# Patient Record
Sex: Female | Born: 1970
Health system: Southern US, Community
[De-identification: ages and names within clinical notes are randomized; demographics above are authoritative.]

## PROBLEM LIST (undated history)

## (undated) DIAGNOSIS — N6320 Unspecified lump in the left breast, unspecified quadrant: Secondary | ICD-10-CM

## (undated) DIAGNOSIS — I10 Essential (primary) hypertension: Secondary | ICD-10-CM

## (undated) DIAGNOSIS — E785 Hyperlipidemia, unspecified: Secondary | ICD-10-CM

## (undated) DIAGNOSIS — D649 Anemia, unspecified: Secondary | ICD-10-CM

## (undated) DIAGNOSIS — Z9889 Other specified postprocedural states: Secondary | ICD-10-CM

## (undated) HISTORY — DX: Anemia, unspecified: D64.9

## (undated) HISTORY — PX: BREAST EXCISIONAL BIOPSY: SUR124

## (undated) HISTORY — DX: Unspecified lump in the left breast, unspecified quadrant: N63.20

## (undated) HISTORY — DX: Hyperlipidemia, unspecified: E78.5

## (undated) HISTORY — PX: APPENDECTOMY: SHX54

---

## 2007-03-30 ENCOUNTER — Encounter: Payer: Self-pay | Admitting: Family Medicine

## 2007-04-02 ENCOUNTER — Encounter: Payer: Self-pay | Admitting: Family Medicine

## 2007-07-17 ENCOUNTER — Emergency Department (HOSPITAL_COMMUNITY): Admission: EM | Admit: 2007-07-17 | Discharge: 2007-07-18 | Payer: Self-pay | Admitting: Emergency Medicine

## 2007-08-09 ENCOUNTER — Ambulatory Visit: Payer: Self-pay | Admitting: Family Medicine

## 2007-08-09 DIAGNOSIS — E785 Hyperlipidemia, unspecified: Secondary | ICD-10-CM | POA: Insufficient documentation

## 2007-08-09 DIAGNOSIS — R079 Chest pain, unspecified: Secondary | ICD-10-CM

## 2008-12-31 ENCOUNTER — Encounter: Payer: Self-pay | Admitting: Family Medicine

## 2008-12-31 ENCOUNTER — Encounter: Admission: RE | Admit: 2008-12-31 | Discharge: 2008-12-31 | Payer: Self-pay | Admitting: Obstetrics & Gynecology

## 2009-02-05 ENCOUNTER — Inpatient Hospital Stay (HOSPITAL_COMMUNITY): Admission: RE | Admit: 2009-02-05 | Discharge: 2009-02-08 | Payer: Self-pay | Admitting: Obstetrics and Gynecology

## 2009-08-17 ENCOUNTER — Encounter: Admission: RE | Admit: 2009-08-17 | Discharge: 2009-08-17 | Payer: Self-pay | Admitting: Obstetrics & Gynecology

## 2009-08-17 ENCOUNTER — Encounter: Payer: Self-pay | Admitting: Family Medicine

## 2009-09-21 ENCOUNTER — Ambulatory Visit: Payer: Self-pay | Admitting: Family Medicine

## 2009-09-22 LAB — CONVERTED CEMR LAB
Basophils Relative: 0.1 % (ref 0.0–3.0)
Calcium: 8.9 mg/dL (ref 8.4–10.5)
Cholesterol: 248 mg/dL — ABNORMAL HIGH (ref 0–200)
Creatinine, Ser: 0.6 mg/dL (ref 0.4–1.2)
Direct LDL: 195.9 mg/dL
Glucose, Bld: 99 mg/dL (ref 70–99)
HCT: 40.6 % (ref 36.0–46.0)
HDL: 50.4 mg/dL (ref >39.00)
Hemoglobin: 13.6 g/dL (ref 12.0–15.0)
Lymphocytes Relative: 54.8 % — ABNORMAL HIGH (ref 12.0–46.0)
MCHC: 33.6 g/dL (ref 30.0–36.0)
MCV: 83.8 fL (ref 78.0–100.0)
Monocytes Relative: 7 % (ref 3.0–12.0)
Neutro Abs: 1.7 10*3/uL (ref 1.4–7.7)
Neutrophils Relative %: 37.5 % — ABNORMAL LOW (ref 43.0–77.0)
Potassium: 4.4 meq/L (ref 3.5–5.1)
RBC: 4.84 M/uL (ref 3.87–5.11)
RDW: 12.8 % (ref 11.5–14.6)
Total CHOL/HDL Ratio: 5
Total Protein: 7.1 g/dL (ref 6.0–8.3)
VLDL: 16.4 mg/dL (ref 0.0–40.0)

## 2009-09-28 ENCOUNTER — Ambulatory Visit: Payer: Self-pay | Admitting: Family Medicine

## 2009-09-28 ENCOUNTER — Other Ambulatory Visit: Admission: RE | Admit: 2009-09-28 | Discharge: 2009-09-28 | Payer: Self-pay | Admitting: Family Medicine

## 2009-09-28 ENCOUNTER — Encounter: Payer: Self-pay | Admitting: Family Medicine

## 2009-09-28 DIAGNOSIS — N63 Unspecified lump in unspecified breast: Secondary | ICD-10-CM | POA: Insufficient documentation

## 2009-10-02 ENCOUNTER — Encounter: Payer: Self-pay | Admitting: Internal Medicine

## 2009-10-26 ENCOUNTER — Ambulatory Visit: Payer: Self-pay | Admitting: Family Medicine

## 2009-10-30 LAB — CONVERTED CEMR LAB
Direct LDL: 182.1 mg/dL
HDL: 58.5 mg/dL (ref >39.00)
Total CHOL/HDL Ratio: 4
VLDL: 12.8 mg/dL (ref 0.0–40.0)

## 2010-03-09 ENCOUNTER — Ambulatory Visit: Payer: Self-pay | Admitting: Family Medicine

## 2010-03-10 LAB — CONVERTED CEMR LAB
Cholesterol: 229 mg/dL — ABNORMAL HIGH (ref 0–200)
Total CHOL/HDL Ratio: 4
Triglycerides: 73 mg/dL (ref 0.0–149.0)
VLDL: 14.6 mg/dL (ref 0.0–40.0)

## 2010-03-11 ENCOUNTER — Encounter: Admission: RE | Admit: 2010-03-11 | Discharge: 2010-03-11 | Payer: Self-pay | Admitting: Family Medicine

## 2010-03-11 LAB — HM MAMMOGRAPHY

## 2010-03-25 ENCOUNTER — Encounter: Payer: Self-pay | Admitting: Family Medicine

## 2010-03-30 ENCOUNTER — Ambulatory Visit (HOSPITAL_BASED_OUTPATIENT_CLINIC_OR_DEPARTMENT_OTHER): Admission: RE | Admit: 2010-03-30 | Discharge: 2010-03-30 | Payer: Self-pay | Admitting: General Surgery

## 2010-03-30 HISTORY — PX: BREAST LUMPECTOMY: SHX2

## 2010-03-30 HISTORY — PX: OTHER SURGICAL HISTORY: SHX169

## 2010-09-23 ENCOUNTER — Ambulatory Visit: Payer: Self-pay | Admitting: Family Medicine

## 2010-09-24 LAB — CONVERTED CEMR LAB
AST: 14 units/L (ref 0–37)
Alkaline Phosphatase: 39 units/L (ref 39–117)
BUN: 8 mg/dL (ref 6–23)
Basophils Absolute: 0 10*3/uL (ref 0.0–0.1)
Calcium: 8.8 mg/dL (ref 8.4–10.5)
Creatinine, Ser: 0.6 mg/dL (ref 0.4–1.2)
GFR calc non Af Amer: 150.42 mL/min (ref >60)
HCT: 35.8 % — ABNORMAL LOW (ref 36.0–46.0)
Lymphocytes Relative: 58.2 % — ABNORMAL HIGH (ref 12.0–46.0)
Lymphs Abs: 2.3 10*3/uL (ref 0.7–4.0)
Monocytes Absolute: 0.2 10*3/uL (ref 0.1–1.0)
Neutro Abs: 1.4 10*3/uL (ref 1.4–7.7)
Platelets: 242 10*3/uL (ref 150.0–400.0)
Potassium: 4.5 meq/L (ref 3.5–5.1)
RDW: 16.3 % — ABNORMAL HIGH (ref 11.5–14.6)
Sodium: 134 meq/L — ABNORMAL LOW (ref 135–145)
Total Protein: 6.9 g/dL (ref 6.0–8.3)
Triglycerides: 83 mg/dL (ref 0.0–149.0)

## 2010-09-30 ENCOUNTER — Ambulatory Visit: Payer: Self-pay | Admitting: Family Medicine

## 2010-09-30 ENCOUNTER — Other Ambulatory Visit: Admission: RE | Admit: 2010-09-30 | Discharge: 2010-09-30 | Payer: Self-pay | Admitting: Family Medicine

## 2010-09-30 LAB — HM PAP SMEAR

## 2010-10-05 ENCOUNTER — Encounter: Payer: Self-pay | Admitting: Family Medicine

## 2010-12-28 NOTE — Assessment & Plan Note (Signed)
Summary: PT FASTING/CPX/WITH PAP/RCD   Vital Signs:  Patient profile:   40 year old female Menstrual status:  irregular Height:      65 inches Weight:      136 pounds BMI:     22.71 O2 Sat:      95 % Temp:     98.6 degrees F Pulse rate:   73 / minute BP sitting:   120 / 80  (left arm) Cuff size:   regular  Vitals Entered By: Pura Spice, RN (September 23, 2010 10:05 AM) CC: cpx no pap today on menses fasting for repeat on labs from 03-09-10   History of Present Illness: 40 yr old female for a cpx. She is on her menses so we have to do her Pap smear at a later date. She feels fine and has no complaints.   Allergies (verified): No Known Drug Allergies  Past History:  Past Medical History: Hyperlipidemia left breast lump, benign fibroadenoma  Past Surgical History: Appendectomy removal of left breast lump 03-30-10 per Dr. Claud Kelp  Family History: Reviewed history from 09/21/2009 and no changes required.  Family History Breast cancer 1st degree relative <50 (sister)  Social History: Reviewed history from 08/09/2007 and no changes required. Occupation: CNA Married Never Smoked Alcohol use-no Drug use-no  Review of Systems  The patient denies anorexia, fever, weight loss, weight gain, vision loss, decreased hearing, hoarseness, chest pain, syncope, dyspnea on exertion, peripheral edema, prolonged cough, headaches, hemoptysis, abdominal pain, melena, hematochezia, severe indigestion/heartburn, hematuria, incontinence, genital sores, muscle weakness, suspicious skin lesions, transient blindness, difficulty walking, depression, unusual weight change, abnormal bleeding, enlarged lymph nodes, angioedema, breast masses, and testicular masses.         Flu Vaccine Consent Questions     Do you have a history of severe allergic reactions to this vaccine? no    Any prior history of allergic reactions to egg and/or gelatin? no    Do you have a sensitivity to the preservative  Thimersol? no    Do you have a past history of Guillan-Barre Syndrome? no    Do you currently have an acute febrile illness? no    Have you ever had a severe reaction to latex? no    Vaccine information given and explained to patient? yes    Are you currently pregnant? no    Lot Number:[id number]   Exp Date:05/28/2011   Site Given  Left Deltoid IM Pura Spice, RN  September 23, 2010 10:18 AM    Physical Exam  General:  Well-developed,well-nourished,in no acute distress; alert,appropriate and cooperative throughout examination Head:  Normocephalic and atraumatic without obvious abnormalities. No apparent alopecia or balding. Eyes:  No corneal or conjunctival inflammation noted. EOMI. Perrla. Funduscopic exam benign, without hemorrhages, exudates or papilledema. Vision grossly normal. Ears:  External ear exam shows no significant lesions or deformities.  Otoscopic examination reveals clear canals, tympanic membranes are intact bilaterally without bulging, retraction, inflammation or discharge. Hearing is grossly normal bilaterally. Nose:  External nasal examination shows no deformity or inflammation. Nasal mucosa are pink and moist without lesions or exudates. Mouth:  Oral mucosa and oropharynx without lesions or exudates.  Teeth in good repair. Neck:  No deformities, masses, or tenderness noted. Chest Wall:  No deformities, masses, or tenderness noted. Lungs:  Normal respiratory effort, chest expands symmetrically. Lungs are clear to auscultation, no crackles or wheezes. Heart:  Normal rate and regular rhythm. S1 and S2 normal without gallop, murmur, click, rub or other  extra sounds. Abdomen:  Bowel sounds positive,abdomen soft and non-tender without masses, organomegaly or hernias noted. Msk:  No deformity or scoliosis noted of thoracic or lumbar spine.   Pulses:  R and L carotid,radial,femoral,dorsalis pedis and posterior tibial pulses are full and equal bilaterally Extremities:  No  clubbing, cyanosis, edema, or deformity noted with normal full range of motion of all joints.   Neurologic:  No cranial nerve deficits noted. Station and gait are normal. Plantar reflexes are down-going bilaterally. DTRs are symmetrical throughout. Sensory, motor and coordinative functions appear intact. Skin:  Intact without suspicious lesions or rashes Cervical Nodes:  No lymphadenopathy noted Axillary Nodes:  No palpable lymphadenopathy Inguinal Nodes:  No significant adenopathy Psych:  Cognition and judgment appear intact. Alert and cooperative with normal attention span and concentration. No apparent delusions, illusions, hallucinations   Impression & Recommendations:  Problem # 1:  HEALTH MAINTENANCE EXAM (ICD-V70.0)  Orders: UA Dipstick w/o Micro (automated)  (81003) Venipuncture ([address]) TLB-Lipid Panel (80061-LIPID) TLB-BMP (Basic Metabolic Panel-BMET) (80048-METABOL) TLB-CBC Platelet - w/Differential (85025-CBCD) TLB-Hepatic/Liver Function Pnl (80076-HEPATIC) TLB-TSH (Thyroid Stimulating Hormone) (84443-TSH)  Complete Medication List: 1)  Zocor 40 Mg Tabs (Simvastatin) .Marland Kitchen.. 1 once daily  Other Orders: Admin 1st Vaccine (60454) Flu Vaccine 48yrs + [phone])  Patient Instructions: 1)  get labs today   Orders Added: 1)  Admin 1st Vaccine [90471] 2)  Flu Vaccine 42yrs + [90658] 3)  Est. Patient 18-39 years [99395] 4)  UA Dipstick w/o Micro (automated)  [81003] 5)  Venipuncture [36415] 6)  TLB-Lipid Panel [80061-LIPID] 7)  TLB-BMP (Basic Metabolic Panel-BMET) [80048-METABOL] 8)  TLB-CBC Platelet - w/Differential [85025-CBCD] 9)  TLB-Hepatic/Liver Function Pnl [80076-HEPATIC] 10)  TLB-TSH (Thyroid Stimulating Hormone) [91478-GNF]  Appended Document: Orders Update    Clinical Lists Changes  Orders: Added new Service order of Specimen Handling (62130) - Signed Observations: Added new observation of COMMENTS: Wynona Canes, CMA  September 23, 2010 1:19 PM   (09/23/2010 10:48) Added new observation of PH URINE: 7.0  (09/23/2010 10:48) Added new observation of SPEC GR URIN: 1.020  (09/23/2010 10:48) Added new observation of APPEARANCE U: Clear  (09/23/2010 10:48) Added new observation of UA COLOR: yellow  (09/23/2010 10:48) Added new observation of WBC DIPSTK U: negative  (09/23/2010 10:48) Added new observation of NITRITE URN: negative  (09/23/2010 10:48) Added new observation of UROBILINOGEN: 0.2  (09/23/2010 10:48) Added new observation of PROTEIN, URN: 1+  (09/23/2010 10:48) Added new observation of BLOOD UR DIP: 3+  (09/23/2010 10:48) Added new observation of KETONES URN: negative  (09/23/2010 10:48) Added new observation of BILIRUBIN UR: negative  (09/23/2010 10:48) Added new observation of GLUCOSE, URN: negative  (09/23/2010 10:48)      Laboratory Results   Urine Tests  Date/Time Recieved: September 23, 2010 1:19 PM  Date/Time Reported: September 23, 2010 1:19 PM   Routine Urinalysis   Color: yellow Appearance: Clear Glucose: negative   (Normal Range: Negative) Bilirubin: negative   (Normal Range: Negative) Ketone: negative   (Normal Range: Negative) Spec. Gravity: 1.020   (Normal Range: 1.003-1.035) Blood: 3+   (Normal Range: Negative) pH: 7.0   (Normal Range: 5.0-8.0) Protein: 1+   (Normal Range: Negative) Urobilinogen: 0.2   (Normal Range: 0-1) Nitrite: negative   (Normal Range: Negative) Leukocyte Esterace: negative   (Normal Range: Negative)    Comments: Wynona Canes, CMA  September 23, 2010 1:19 PM      Appended Document: PT FASTING/CPX/WITH PAP/RCD of note, she was on her menses that day

## 2010-12-28 NOTE — Letter (Signed)
Summary: Report of Medical Examination  Report of Medical Examination   Imported By: Maryln Gottron 05/14/2010 15:41:31  _____________________________________________________________________  External Attachment:    Type:   Image     Comment:   External Document

## 2010-12-28 NOTE — Assessment & Plan Note (Signed)
Summary: PAP ONLY/RCD   Vital Signs:  Patient profile:   40 year old female Menstrual status:  irregular LMP:     08/28/2010 BP sitting:   120 / 80  (left arm) Cuff size:   regular  Vitals Entered By: Pura Spice, RN (September 30, 2010 9:39 AM) CC: pap only  LMP (date): 08/28/2010     Enter LMP: 08/28/2010 Last PAP Result NEGATIVE FOR INTRAEPITHELIAL LESIONS OR MALIGNANCY.   History of Present Illness: Here for a breast and pelvic exam after her cpx on 09-23-10. She is doing well with no complaints.   Allergies: No Known Drug Allergies  Past History:  Past Medical History: Reviewed history from 09/23/2010 and no changes required. Hyperlipidemia left breast lump, benign fibroadenoma  Past Surgical History: Reviewed history from 09/23/2010 and no changes required. Appendectomy removal of left breast lump 03-30-10 per Dr. Claud Kelp  Review of Systems  The patient denies anorexia, fever, weight loss, weight gain, vision loss, decreased hearing, hoarseness, chest pain, syncope, dyspnea on exertion, peripheral edema, prolonged cough, headaches, hemoptysis, abdominal pain, melena, hematochezia, severe indigestion/heartburn, hematuria, incontinence, genital sores, muscle weakness, suspicious skin lesions, transient blindness, difficulty walking, depression, unusual weight change, abnormal bleeding, enlarged lymph nodes, angioedema, breast masses, and testicular masses.    Physical Exam  General:  Well-developed,well-nourished,in no acute distress; alert,appropriate and cooperative throughout examination Breasts:  No mass, nodules, thickening, tenderness, bulging, retraction, inflamation, nipple discharge or skin changes noted.   Genitalia:  Pelvic Exam:        External: normal female genitalia without lesions or masses        Vagina: normal without lesions or masses        Cervix: normal without lesions or masses        Adnexa: normal bimanual exam without masses or  fullness        Uterus: normal by palpation        Pap smear: performed   Impression & Recommendations:  Problem # 1:  LUMP OR MASS IN BREAST (ICD-611.72)  Complete Medication List: 1)  Lipitor 40 Mg Tabs (Atorvastatin calcium) .Marland Kitchen.. 1 by mouth once daily  Patient Instructions: 1)  Please schedule a follow-up appointment in 1 year.    Orders Added: 1)  Est. Patient Level IV [13244]

## 2010-12-28 NOTE — Letter (Signed)
Summary: Results Follow-up Letter  [facility]  [address] [place], [place] [address]   Phone: [phone]  Fax: [phone]    10/05/2010  [address] [place], [place]  [address]  Dear Ms. Mayford Knife,   The following are the results of your recent test(s):  Test     Result     Pap Smear    Normal____yes repeat 1 year ___     Sincerely,  Dr Kathie Rhodes. Clent Ridges MD  [place] [facility]

## 2010-12-28 NOTE — Assessment & Plan Note (Signed)
Summary: ROA/FUP/RCD   Vital Signs:  Patient profile:   40 year old female Menstrual status:  irregular Weight:      136 pounds BP sitting:   126 / 86  (left arm) Cuff size:   regular  Vitals Entered By: Raechel Ache, RN (March 09, 2010 8:56 AM) CC: ROV, fasting. Still has lump L breast- wants it checked. Has not had mammogram and no longer breastfeeding Comments rara   History of Present Illness: Here to follow up a left breast lump as well as her cholesterol. She has been watching a strict diet for 3 months. Her last breast US on 08-17-10 demonstrated the lump had decreased slightly in size. She has stopped breast feeding, and now we are ready to get her first mammogram. The lump is still present, but never painful.   Allergies: No Known Drug Allergies  Past History:  Past Medical History: Reviewed history from 09/28/2009 and no changes required. Hyperlipidemia left breast lump  Past Surgical History: Reviewed history from 08/09/2007 and no changes required. Appendectomy  Review of Systems  The patient denies anorexia, fever, weight loss, weight gain, vision loss, decreased hearing, hoarseness, chest pain, syncope, dyspnea on exertion, peripheral edema, prolonged cough, headaches, hemoptysis, abdominal pain, melena, hematochezia, severe indigestion/heartburn, hematuria, incontinence, genital sores, muscle weakness, suspicious skin lesions, transient blindness, difficulty walking, depression, unusual weight change, abnormal bleeding, enlarged lymph nodes, angioedema, breast masses, and testicular masses.    Physical Exam  General:  Well-developed,well-nourished,in no acute distress; alert,appropriate and cooperative throughout examination Breasts:  small, well defined, round, smooth, nontender lump in the left breast at the 8 o'clock position   Impression & Recommendations:  Problem # 1:  LUMP OR MASS IN BREAST (ICD-611.72)  Orders: Misc. Referral (Misc.  Ref)  Problem # 2:  HYPERLIPIDEMIA (ICD-272.4)  Orders: Venipuncture ([address]) TLB-Lipid Panel (80061-LIPID)  Patient Instructions: 1)  We will recheck her lipids today. Will set up a mammogram and [place] soon on her breasts.

## 2010-12-28 NOTE — Letter (Signed)
Summary: [facility]   Imported By: Maryln Gottron 04/05/2010 15:20:31  _____________________________________________________________________  External Attachment:    Type:   Image     Comment:   External Document

## 2011-02-15 LAB — DIFFERENTIAL
Basophils Relative: 0 % (ref 0–1)
Eosinophils Absolute: 0 10*3/uL (ref 0.0–0.7)
Lymphocytes Relative: 50 % — ABNORMAL HIGH (ref 12–46)
Lymphs Abs: 2.2 10*3/uL (ref 0.7–4.0)
Neutro Abs: 2 10*3/uL (ref 1.7–7.7)
Neutrophils Relative %: 44 % (ref 43–77)

## 2011-02-15 LAB — COMPREHENSIVE METABOLIC PANEL
CO2: 29 mEq/L (ref 19–32)
Calcium: 9.3 mg/dL (ref 8.4–10.5)
GFR calc Af Amer: >60 mL/min (ref >60)
Glucose, Bld: 91 mg/dL (ref 70–99)
Potassium: 4.6 mEq/L (ref 3.5–5.1)
Sodium: 140 mEq/L (ref 135–145)

## 2011-02-15 LAB — URINALYSIS, ROUTINE W REFLEX MICROSCOPIC
Glucose, UA: NEGATIVE mg/dL
Hgb urine dipstick: NEGATIVE
Protein, ur: NEGATIVE mg/dL
Specific Gravity, Urine: 1.025 (ref 1.005–1.030)
Urobilinogen, UA: 1 mg/dL (ref 0.0–1.0)
pH: 7 (ref 5.0–8.0)

## 2011-02-15 LAB — URINE MICROSCOPIC-ADD ON

## 2011-02-15 LAB — CBC
Hemoglobin: 12.7 g/dL (ref 12.0–15.0)
MCHC: 32.6 g/dL (ref 30.0–36.0)
RBC: 4.88 MIL/uL (ref 3.87–5.11)
WBC: 4.5 10*3/uL (ref 4.0–10.5)

## 2011-03-10 LAB — CBC
Hemoglobin: 10.8 g/dL — ABNORMAL LOW (ref 12.0–15.0)
Hemoglobin: 11.7 g/dL — ABNORMAL LOW (ref 12.0–15.0)
MCHC: 32.6 g/dL (ref 30.0–36.0)
MCHC: 32.9 g/dL (ref 30.0–36.0)
MCV: 84.1 fL (ref 78.0–100.0)
MCV: 84.3 fL (ref 78.0–100.0)
Platelets: 213 10*3/uL (ref 150–400)
Platelets: 233 10*3/uL (ref 150–400)
RBC: 4.41 MIL/uL (ref 3.87–5.11)
RDW: 16.3 % — ABNORMAL HIGH (ref 11.5–15.5)
RDW: 16.5 % — ABNORMAL HIGH (ref 11.5–15.5)

## 2011-04-05 ENCOUNTER — Other Ambulatory Visit: Payer: Self-pay | Admitting: Family Medicine

## 2011-04-05 DIAGNOSIS — Z1231 Encounter for screening mammogram for malignant neoplasm of breast: Secondary | ICD-10-CM

## 2011-04-12 NOTE — Discharge Summary (Signed)
NAME:  Alyssa Khan, Alyssa Khan            ACCOUNT NO.:  [id number]   MEDICAL RECORD NO.:  [id number]          PATIENT TYPE:  INP   LOCATION:  9125                          FACILITY:  [facility]   PHYSICIAN:  Gerrit Friends. Aldona Bar, M.D.   DATE OF BIRTH:  1971-06-15   DATE OF ADMISSION:  02/05/2009  DATE OF DISCHARGE:  02/08/2009                               DISCHARGE SUMMARY   DISCHARGE DIAGNOSIS:  1. Term pregnancy, delivered, 7 pounds 8 ounces, female infant, Apgars 9      and 9.  2. Blood type B+.  3. Repeat cesarean section at term.   PROCEDURE:  Repeat cesarean section at term.   SUMMARY:  This 40 year old gravida 3, now para 2 was admitted at 27  weeks' gestation for a repeat cesarean section having been delivered of  her first baby in 2003 by cesarean section.  She was taken to the  operating room on February 05, 2009, by Dr. Henderson Cloud and was delivered of a  female infant weighing 7 pounds 7 ounces with good Apgars.  Her  postoperative course was benign.  Her discharge hemoglobin was 11.7 with  a white count of 8500 and a platelet count of 264,000.  On the morning  of February 08, 2009, she was ambulating well, tolerating a regular diet  well, having normal bowel and bladder function and was afebrile.  Her  breast-feeding was going well and she was desirous of discharge.  Accordingly, her staples were removed and her wound was Steri-Stripped  with Benzoin.  She was given all appropriate instructions on the morning  of February 08, 2009, per discharge brochure and understood all  instructions well.   Discharge medications include vitamins one a day as long as she is  breast-feeding and she was given prescriptions for Motrin 600 mg to use  every 6 hours as needed for cramping or mild pain and Tylox 1 to 2 every  4-6 hours as needed for more severe pain.  She will return to the office  in approximately four weeks' time for postpartum followup or as needed.   CONDITION ON DISCHARGE:  Improved.      Gerrit Friends. Aldona Bar, M.D.  Electronically Signed     RMW/MEDQ  D:  02/08/2009  T:  02/09/2009  Job:  [id number]

## 2011-04-12 NOTE — Op Note (Signed)
NAME:  Alyssa Khan, Alyssa Khan            ACCOUNT NO.:  [id number]   MEDICAL RECORD NO.:  [id number]          PATIENT TYPE:  INP   LOCATION:  9199                          FACILITY:  [facility]   PHYSICIAN:  Carrington Clamp, M.D. DATE OF BIRTH:  [birth year]   DATE OF PROCEDURE:  DATE OF DISCHARGE:                               OPERATIVE REPORT   PREOPERATIVE DIAGNOSIS:  Repeat cesarean section at term.   POSTOPERATIVE DIAGNOSIS:  Repeat cesarean section at term.   PROCEDURE:  Primary low-transverse cesarean section.   SURGEON:  Carrington Clamp, MD.   ANESTHESIA:  Spinal.   FINDINGS:  Female infant, Apgars 9 and 9, weight 7 pounds 7 ounces.  Normal tubes, ovaries, and uterus seen.   SPECIMENS:  None.   ESTIMATED BLOOD LOSS:  600 mL.   IV FLUIDS:  2400 mL.   URINE OUTPUT:  400 mL.   COMPLICATIONS:  None.   TECHNIQUE:  After adequate spinal anesthesia was achieved, the patient  prepped and draped in usual sterile fashion in dorsal supine position  with leftward tilt.  A Pfannenstiel skin incision was made with the  scalpel and carried down to the fascia with Bovie cautery.  Fascia was  incised in midline with the scalpel and carried in transverse  curvilinear manner with the Mayo scissors.  The rectus muscles were  reflected from the fascia superiorly and inferiorly with blunt and sharp  dissection.  The rectus muscles were split in the midline with a scalpel  and then carefully a portion of peritoneum was entered into with the  Metzenbaum scissors.  The incision was extended in superior and inferior  manner with good visualization and the bowel and the bladder.   The Alexis instrument was introduced and the vesicouterine fascia tented  up and incised in transverse curvilinear manner.  Bladder flap was  created and retracted inferiorly.  A 2-cm incision made in the upper  portion of the lower uterine segment until clear fluid was noted on  entry into the amnion.  The uterine incision  was extended manually and  also with the bandage scissors.  Baby was identified in the vertex  presentation, delivered without any complication.  Baby was bulb  suctioned.  Cord was clamped and cut and baby was handed to awaiting  pediatrics.   The uterus was cleared of all debris and the uterine incision then  reapproximated with running-locked stitch of 0 Monocryl.  An imbricating  layer of 0 Monocryl was performed as well.  Once hemostasis was achieved  and irrigation was performed, all instruments were withdrawn from the  abdomen and peritoneum was closed with running stitch of 2-0 Vicryl.  This incorporated the rectus muscles as a separate layer.  A single  stitch was placed in the rectus muscle to ensure hemostasis of the  vessel that had been incised during the dissection.  The fascia was then  closed with running stitch of 0 Vicryl.  The subcutaneous tissue was  rendered hemostatic with Bovie cautery and irrigation.  This layer was  then closed with interrupted stitches of 2-0 plain gut.  These skin was  closed with staples.  The patient tolerated the procedure well and was  returned to recovery room in stable condition.      Carrington Clamp, M.D.  Electronically Signed     MH/MEDQ  D:  02/05/2009  T:  02/06/2009  Job:  [id number]

## 2011-04-15 ENCOUNTER — Ambulatory Visit
Admission: RE | Admit: 2011-04-15 | Discharge: 2011-04-15 | Disposition: A | Discharge status: Home / Self Care | Payer: Commercial Managed Care - PPO | Source: Ambulatory Visit | Attending: Family Medicine | Admitting: Family Medicine

## 2011-04-15 DIAGNOSIS — Z1231 Encounter for screening mammogram for malignant neoplasm of breast: Secondary | ICD-10-CM

## 2011-09-09 LAB — TYPE AND SCREEN: Antibody Screen: NEGATIVE

## 2011-09-09 LAB — URINALYSIS, ROUTINE W REFLEX MICROSCOPIC
Protein, ur: NEGATIVE
Specific Gravity, Urine: 1.018
Urobilinogen, UA: 0.2
pH: 7.5

## 2011-09-09 LAB — URINE MICROSCOPIC-ADD ON

## 2011-09-09 LAB — GC/CHLAMYDIA PROBE AMP, GENITAL
Chlamydia, DNA Probe: NEGATIVE
GC Probe Amp, Genital: NEGATIVE

## 2011-09-09 LAB — RPR: RPR Ser Ql: NONREACTIVE

## 2011-09-26 ENCOUNTER — Encounter: Payer: Self-pay | Admitting: Family Medicine

## 2011-09-26 ENCOUNTER — Other Ambulatory Visit (HOSPITAL_COMMUNITY)
Admission: RE | Admit: 2011-09-26 | Discharge: 2011-09-26 | Disposition: A | Discharge status: Home / Self Care | Payer: 59 | Source: Ambulatory Visit | Attending: Family Medicine | Admitting: Family Medicine

## 2011-09-26 ENCOUNTER — Ambulatory Visit (INDEPENDENT_AMBULATORY_CARE_PROVIDER_SITE_OTHER): Payer: Commercial Managed Care - PPO | Admitting: Family Medicine

## 2011-09-26 VITALS — BP 124/80 | HR 77 | Temp 99.0°F | Ht 65.5 in | Wt 146.0 lb

## 2011-09-26 DIAGNOSIS — Z23 Encounter for immunization: Secondary | ICD-10-CM

## 2011-09-26 DIAGNOSIS — Z0000 Encounter for general adult medical examination without abnormal findings: Secondary | ICD-10-CM

## 2011-09-26 DIAGNOSIS — Z01419 Encounter for gynecological examination (general) (routine) without abnormal findings: Secondary | ICD-10-CM | POA: Insufficient documentation

## 2011-09-26 LAB — CBC WITH DIFFERENTIAL/PLATELET
Basophils Relative: 0.5 % (ref 0.0–3.0)
Eosinophils Absolute: 0 10*3/uL (ref 0.0–0.7)
Eosinophils Relative: 0.6 % (ref 0.0–5.0)
HCT: 34.5 % — ABNORMAL LOW (ref 36.0–46.0)
Lymphs Abs: 2.9 10*3/uL (ref 0.7–4.0)
MCHC: 32.4 g/dL (ref 30.0–36.0)
MCV: 73.3 fl — ABNORMAL LOW (ref 78.0–100.0)
Monocytes Absolute: 0.3 10*3/uL (ref 0.1–1.0)
Neutro Abs: 2 10*3/uL (ref 1.4–7.7)
Neutrophils Relative %: 37.3 % — ABNORMAL LOW (ref 43.0–77.0)
RBC: 4.7 Mil/uL (ref 3.87–5.11)
WBC: 5.3 10*3/uL (ref 4.5–10.5)

## 2011-09-26 LAB — HEPATIC FUNCTION PANEL
AST: 27 U/L (ref 0–37)
Albumin: 4.4 g/dL (ref 3.5–5.2)
Alkaline Phosphatase: 47 U/L (ref 39–117)
Bilirubin, Direct: 0.1 mg/dL (ref 0.0–0.3)
Total Protein: 7.5 g/dL (ref 6.0–8.3)

## 2011-09-26 LAB — BASIC METABOLIC PANEL
CO2: 24 mEq/L (ref 19–32)
GFR: 104.73 mL/min (ref >60.00)
Glucose, Bld: 93 mg/dL (ref 70–99)
Potassium: 4 mEq/L (ref 3.5–5.1)
Sodium: 138 mEq/L (ref 135–145)

## 2011-09-26 LAB — POCT URINALYSIS DIPSTICK
Bilirubin, UA: NEGATIVE
Blood, UA: NEGATIVE
Nitrite, UA: NEGATIVE
Spec Grav, UA: 1.025
pH, UA: 5

## 2011-09-26 LAB — LIPID PANEL: Total CHOL/HDL Ratio: 3

## 2011-09-26 MED ORDER — ATORVASTATIN CALCIUM 40 MG PO TABS: 40.0000 mg | ORAL_TABLET | Freq: Every day | ORAL | Status: DC

## 2011-09-26 NOTE — Progress Notes (Signed)
Addended by: Aniceto Boss A on: 09/26/2011 12:10 PM   Modules accepted: Orders

## 2011-09-26 NOTE — Progress Notes (Signed)
Addended by: Aniceto Boss A on: 09/26/2011 11:59 AM   Modules accepted: Orders

## 2011-09-26 NOTE — Progress Notes (Signed)
Subjective:    Patient ID: Alyssa Khan, female    DOB: 06-19-1971, 40 y.o.   MRN: [id number]  HPI 40 yr old female for a cpx. She feels fine and has no concerns. Her menses are regular and unremarkable. She had a normal mammogram on 04-05-11.    Review of Systems  Constitutional: Negative.  Negative for fever, diaphoresis, activity change, appetite change, fatigue and unexpected weight change.  HENT: Negative.  Negative for hearing loss, ear pain, nosebleeds, congestion, sore throat, trouble swallowing, neck pain, neck stiffness, voice change and tinnitus.   Eyes: Negative.  Negative for photophobia, pain, discharge, redness and visual disturbance.  Respiratory: Negative.  Negative for apnea, cough, choking, chest tightness, shortness of breath, wheezing and stridor.   Cardiovascular: Negative.  Negative for chest pain, palpitations and leg swelling.  Gastrointestinal: Negative.  Negative for nausea, vomiting, abdominal pain, diarrhea, constipation, blood in stool, abdominal distention and rectal pain.  Genitourinary: Negative.  Negative for dysuria, urgency, frequency, hematuria, flank pain, vaginal bleeding, vaginal discharge, enuresis, difficulty urinating, vaginal pain and menstrual problem.  Musculoskeletal: Negative.  Negative for myalgias, back pain, joint swelling, arthralgias and gait problem.  Skin: Negative.  Negative for color change, pallor, rash and wound.  Neurological: Negative.  Negative for dizziness, tremors, seizures, syncope, speech difficulty, weakness, light-headedness, numbness and headaches.  Hematological: Negative.  Negative for adenopathy. Does not bruise/bleed easily.  Psychiatric/Behavioral: Negative.  Negative for hallucinations, behavioral problems, confusion, sleep disturbance, dysphoric mood and agitation. The patient is not nervous/anxious.        Objective:   Physical Exam  Constitutional: She appears well-developed and well-nourished. No distress.    HENT:  Head: Normocephalic and atraumatic.  Right Ear: External ear normal.  Left Ear: External ear normal.  Nose: Nose normal.  Mouth/Throat: Oropharynx is clear and moist. No oropharyngeal exudate.  Eyes: Conjunctivae and EOM are normal. Pupils are equal, round, and reactive to light. Right eye exhibits no discharge. Left eye exhibits no discharge. No scleral icterus.  Neck: Normal range of motion. Neck supple. No JVD present. No thyromegaly present.  Cardiovascular: Normal rate, regular rhythm, normal heart sounds and intact distal pulses.  Exam reveals no gallop and no friction rub.   No murmur heard. Pulmonary/Chest: Effort normal and breath sounds normal. No stridor. No respiratory distress. She has no wheezes. She has no rales. She exhibits no tenderness.  Abdominal: Soft. Normal appearance and bowel sounds are normal. She exhibits no distension, no abdominal bruit, no ascites and no mass. There is no hepatosplenomegaly. There is no tenderness. There is no rigidity, no rebound and no guarding. No hernia.  Genitourinary: Rectum normal, vagina normal and uterus normal. No breast swelling, tenderness, discharge or bleeding. Cervix exhibits no motion tenderness, no discharge and no friability. Right adnexum displays no mass, no tenderness and no fullness. Left adnexum displays no mass, no tenderness and no fullness. No erythema, tenderness or bleeding around the vagina. No vaginal discharge found.  Musculoskeletal: Normal range of motion. She exhibits no edema and no tenderness.  Lymphadenopathy:    She has no cervical adenopathy.  Neurological: She is alert. She has normal reflexes. No cranial nerve deficit. She exhibits normal muscle tone. Coordination normal.  Skin: Skin is warm and dry. No rash noted. She is not diaphoretic. No erythema. No pallor.  Psychiatric: She has a normal mood and affect. Her behavior is normal. Judgment and thought content normal.          Assessment & Plan:  Well exam. Get fasting labs today

## 2011-09-28 ENCOUNTER — Telehealth: Payer: Self-pay | Admitting: Family Medicine

## 2011-09-28 ENCOUNTER — Encounter: Payer: Self-pay | Admitting: Family Medicine

## 2011-09-28 NOTE — Telephone Encounter (Signed)
Message copied by Baldemar Friday on Wed Sep 28, 2011  5:35 PM ------      Message from: Gershon Crane A      Created: Tue Sep 27, 2011  8:47 AM       Normal except stable mild anemia. Her cholesterol is excellent

## 2011-09-28 NOTE — Telephone Encounter (Signed)
I spoke with pt and gave results, also put a copy in mail. 

## 2012-03-26 ENCOUNTER — Other Ambulatory Visit: Payer: Self-pay | Admitting: Family Medicine

## 2012-03-26 DIAGNOSIS — Z1231 Encounter for screening mammogram for malignant neoplasm of breast: Secondary | ICD-10-CM

## 2012-04-17 ENCOUNTER — Ambulatory Visit
Admission: RE | Admit: 2012-04-17 | Discharge: 2012-04-17 | Disposition: A | Discharge status: Home / Self Care | Payer: 59 | Source: Ambulatory Visit | Attending: Family Medicine | Admitting: Family Medicine

## 2012-04-17 DIAGNOSIS — Z1231 Encounter for screening mammogram for malignant neoplasm of breast: Secondary | ICD-10-CM

## 2012-04-20 ENCOUNTER — Other Ambulatory Visit: Payer: Self-pay | Admitting: Family Medicine

## 2012-04-20 DIAGNOSIS — R928 Other abnormal and inconclusive findings on diagnostic imaging of breast: Secondary | ICD-10-CM

## 2012-05-02 ENCOUNTER — Ambulatory Visit
Admission: RE | Admit: 2012-05-02 | Discharge: 2012-05-02 | Disposition: A | Discharge status: Home / Self Care | Payer: 59 | Source: Ambulatory Visit | Attending: Family Medicine | Admitting: Family Medicine

## 2012-05-02 DIAGNOSIS — R928 Other abnormal and inconclusive findings on diagnostic imaging of breast: Secondary | ICD-10-CM

## 2012-08-22 ENCOUNTER — Ambulatory Visit (INDEPENDENT_AMBULATORY_CARE_PROVIDER_SITE_OTHER): Payer: 59

## 2012-08-22 DIAGNOSIS — Z23 Encounter for immunization: Secondary | ICD-10-CM

## 2012-09-26 ENCOUNTER — Encounter: Payer: Self-pay | Admitting: Family Medicine

## 2012-09-26 ENCOUNTER — Ambulatory Visit (INDEPENDENT_AMBULATORY_CARE_PROVIDER_SITE_OTHER): Payer: 59 | Admitting: Family Medicine

## 2012-09-26 ENCOUNTER — Encounter: Payer: 59 | Admitting: Family Medicine

## 2012-09-26 VITALS — BP 122/78 | HR 68 | Temp 98.7°F | Ht 66.25 in | Wt 148.0 lb

## 2012-09-26 DIAGNOSIS — Z0000 Encounter for general adult medical examination without abnormal findings: Secondary | ICD-10-CM

## 2012-09-26 LAB — BASIC METABOLIC PANEL
CO2: 25 mEq/L (ref 19–32)
Chloride: 108 mEq/L (ref 96–112)
Potassium: 3.5 mEq/L (ref 3.5–5.1)
Sodium: 139 mEq/L (ref 135–145)

## 2012-09-26 LAB — CBC WITH DIFFERENTIAL/PLATELET
Eosinophils Relative: 0.6 % (ref 0.0–5.0)
HCT: 30.2 % — ABNORMAL LOW (ref 36.0–46.0)
Hemoglobin: 9.1 g/dL — ABNORMAL LOW (ref 12.0–15.0)
Lymphs Abs: 2.1 10*3/uL (ref 0.7–4.0)
MCV: 66.7 fl — ABNORMAL LOW (ref 78.0–100.0)
Monocytes Absolute: 0.3 10*3/uL (ref 0.1–1.0)
Monocytes Relative: 5.8 % (ref 3.0–12.0)
Neutro Abs: 2 10*3/uL (ref 1.4–7.7)
Platelets: 267 10*3/uL (ref 150.0–400.0)
RDW: 18.7 % — ABNORMAL HIGH (ref 11.5–14.6)
WBC: 4.4 10*3/uL — ABNORMAL LOW (ref 4.5–10.5)

## 2012-09-26 LAB — LIPID PANEL
LDL Cholesterol: 98 mg/dL (ref 0–99)
Total CHOL/HDL Ratio: 3

## 2012-09-26 LAB — HEPATIC FUNCTION PANEL
ALT: 20 U/L (ref 0–35)
Alkaline Phosphatase: 38 U/L — ABNORMAL LOW (ref 39–117)
Bilirubin, Direct: 0 mg/dL (ref 0.0–0.3)
Total Bilirubin: 0.5 mg/dL (ref 0.3–1.2)
Total Protein: 7.1 g/dL (ref 6.0–8.3)

## 2012-09-26 LAB — TSH: TSH: 1.13 u[IU]/mL (ref 0.35–5.50)

## 2012-09-26 MED ORDER — ATORVASTATIN CALCIUM 40 MG PO TABS: 40.0000 mg | ORAL_TABLET | Freq: Every day | ORAL | Status: DC

## 2012-09-26 NOTE — Progress Notes (Signed)
  Subjective:    Patient ID: Alyssa Khan, female    DOB: 09/02/1971, 41 y.o.   MRN: [id number]  HPI 41 yr old female for a cpx. She feels well in general although she mentions some occasional chest pains. We discussed these in 2008 and we diagnosed her with costochondritis. These are sharp pains along the sternum which come and go. No SOB. She started her menses 2 days ago.    Review of Systems  Constitutional: Negative.   HENT: Negative.   Eyes: Negative.   Respiratory: Negative.   Cardiovascular: Negative.   Gastrointestinal: Negative.   Genitourinary: Negative for dysuria, urgency, frequency, hematuria, flank pain, decreased urine volume, enuresis, difficulty urinating, pelvic pain and dyspareunia.  Musculoskeletal: Negative.   Skin: Negative.   Neurological: Negative.   Hematological: Negative.   Psychiatric/Behavioral: Negative.        Objective:   Physical Exam  Constitutional: She is oriented to person, place, and time. She appears well-developed and well-nourished. No distress.  HENT:  Head: Normocephalic and atraumatic.  Right Ear: External ear normal.  Left Ear: External ear normal.  Nose: Nose normal.  Mouth/Throat: Oropharynx is clear and moist. No oropharyngeal exudate.  Eyes: Conjunctivae normal and EOM are normal. Pupils are equal, round, and reactive to light. No scleral icterus.  Neck: Normal range of motion. Neck supple. No JVD present. No thyromegaly present.  Cardiovascular: Normal rate, regular rhythm, normal heart sounds and intact distal pulses.  Exam reveals no gallop and no friction rub.   No murmur heard. Pulmonary/Chest: Effort normal and breath sounds normal. No respiratory distress. She has no wheezes. She has no rales. She exhibits no tenderness.  Abdominal: Soft. Bowel sounds are normal. She exhibits no distension and no mass. There is no tenderness. There is no rebound and no guarding.  Musculoskeletal: Normal range of motion. She exhibits no  edema and no tenderness.  Lymphadenopathy:    She has no cervical adenopathy.  Neurological: She is alert and oriented to person, place, and time. She has normal reflexes. No cranial nerve deficit. She exhibits normal muscle tone. Coordination normal.  Skin: Skin is warm and dry. No rash noted. No erythema.  Psychiatric: She has a normal mood and affect. Her behavior is normal. Judgment and thought content normal.          Assessment & Plan:  Well exam. Get fasting labs. She will reschedule at another time for her breast and pelvic exam.

## 2012-10-01 ENCOUNTER — Encounter: Payer: Self-pay | Admitting: Family Medicine

## 2012-10-01 NOTE — Progress Notes (Signed)
Quick Note:  I tried to reach pt by phone, no answer. I put a copy of results in mail. ______ 

## 2012-10-04 ENCOUNTER — Other Ambulatory Visit (HOSPITAL_COMMUNITY)
Admission: RE | Admit: 2012-10-04 | Discharge: 2012-10-04 | Disposition: A | Discharge status: Home / Self Care | Payer: 59 | Source: Ambulatory Visit | Attending: Family Medicine | Admitting: Family Medicine

## 2012-10-04 ENCOUNTER — Ambulatory Visit (INDEPENDENT_AMBULATORY_CARE_PROVIDER_SITE_OTHER): Payer: 59 | Admitting: Family Medicine

## 2012-10-04 ENCOUNTER — Encounter: Payer: Self-pay | Admitting: Family Medicine

## 2012-10-04 VITALS — BP 118/76 | HR 79 | Temp 98.7°F | Wt 148.0 lb

## 2012-10-04 DIAGNOSIS — Z01419 Encounter for gynecological examination (general) (routine) without abnormal findings: Secondary | ICD-10-CM

## 2012-10-04 LAB — POCT URINALYSIS DIPSTICK
Blood, UA: NEGATIVE
Ketones, UA: NEGATIVE
Protein, UA: NEGATIVE
Spec Grav, UA: 1.025
Urobilinogen, UA: 0.2

## 2012-10-04 NOTE — Addendum Note (Signed)
Addended by: Aniceto Boss A on: 10/04/2012 12:28 PM   Modules accepted: Orders

## 2012-10-04 NOTE — Progress Notes (Signed)
  Subjective:    Patient ID: Alyssa Khan, female    DOB: December 21, 1970, 41 y.o.   MRN: [id number]  HPI Here for a pelvic exam. She had a cpx here on 09-26-12 but she was on her menses that day. Otherwise she feels fine.    Review of Systems  Constitutional: Negative.   Genitourinary: Negative.        Objective:   Physical Exam  Constitutional: She appears well-developed and well-nourished.  Genitourinary: Vagina normal and uterus normal. There is no rash, tenderness, lesion or injury on the right labia. There is no rash, tenderness, lesion or injury on the left labia. Cervix exhibits no motion tenderness and no friability. Right adnexum displays no mass, no tenderness and no fullness. Left adnexum displays no mass, no tenderness and no fullness. No vaginal discharge found.          Assessment & Plan:  Routine pelvic exam.

## 2012-10-10 NOTE — Progress Notes (Signed)
Quick Note:  I left voice message with results. ______ 

## 2012-11-14 ENCOUNTER — Encounter: Payer: 59 | Admitting: Family Medicine

## 2013-06-10 ENCOUNTER — Other Ambulatory Visit: Payer: Self-pay

## 2013-06-10 DIAGNOSIS — Z1231 Encounter for screening mammogram for malignant neoplasm of breast: Secondary | ICD-10-CM

## 2013-08-07 ENCOUNTER — Ambulatory Visit
Admission: RE | Admit: 2013-08-07 | Discharge: 2013-08-07 | Disposition: A | Discharge status: Home / Self Care | Payer: 59 | Source: Ambulatory Visit

## 2013-08-07 DIAGNOSIS — Z1231 Encounter for screening mammogram for malignant neoplasm of breast: Secondary | ICD-10-CM

## 2013-08-09 ENCOUNTER — Other Ambulatory Visit: Payer: Self-pay | Admitting: Family Medicine

## 2013-08-09 DIAGNOSIS — R928 Other abnormal and inconclusive findings on diagnostic imaging of breast: Secondary | ICD-10-CM

## 2013-08-27 ENCOUNTER — Ambulatory Visit
Admission: RE | Admit: 2013-08-27 | Discharge: 2013-08-27 | Disposition: A | Discharge status: Home / Self Care | Payer: 59 | Source: Ambulatory Visit | Attending: Family Medicine | Admitting: Family Medicine

## 2013-08-27 DIAGNOSIS — R928 Other abnormal and inconclusive findings on diagnostic imaging of breast: Secondary | ICD-10-CM

## 2013-08-30 ENCOUNTER — Ambulatory Visit (INDEPENDENT_AMBULATORY_CARE_PROVIDER_SITE_OTHER): Payer: 59

## 2013-08-30 DIAGNOSIS — Z23 Encounter for immunization: Secondary | ICD-10-CM

## 2013-09-27 ENCOUNTER — Ambulatory Visit (INDEPENDENT_AMBULATORY_CARE_PROVIDER_SITE_OTHER): Payer: 59 | Admitting: Family Medicine

## 2013-09-27 ENCOUNTER — Encounter: Payer: Self-pay | Admitting: Family Medicine

## 2013-09-27 ENCOUNTER — Other Ambulatory Visit (HOSPITAL_COMMUNITY)
Admission: RE | Admit: 2013-09-27 | Discharge: 2013-09-27 | Disposition: A | Discharge status: Home / Self Care | Payer: 59 | Source: Ambulatory Visit | Attending: Family Medicine | Admitting: Family Medicine

## 2013-09-27 VITALS — BP 120/70 | HR 76 | Temp 98.7°F | Ht 65.0 in | Wt 142.0 lb

## 2013-09-27 DIAGNOSIS — Z0000 Encounter for general adult medical examination without abnormal findings: Secondary | ICD-10-CM

## 2013-09-27 DIAGNOSIS — Z01419 Encounter for gynecological examination (general) (routine) without abnormal findings: Secondary | ICD-10-CM | POA: Insufficient documentation

## 2013-09-27 LAB — HEPATIC FUNCTION PANEL
ALT: 22 U/L (ref 0–35)
Albumin: 4.2 g/dL (ref 3.5–5.2)
Bilirubin, Direct: 0 mg/dL (ref 0.0–0.3)
Total Bilirubin: 0.5 mg/dL (ref 0.3–1.2)
Total Protein: 7.4 g/dL (ref 6.0–8.3)

## 2013-09-27 LAB — CBC WITH DIFFERENTIAL/PLATELET
Basophils Absolute: 0 10*3/uL (ref 0.0–0.1)
Eosinophils Relative: 0.6 % (ref 0.0–5.0)
Lymphocytes Relative: 51.2 % — ABNORMAL HIGH (ref 12.0–46.0)
Lymphs Abs: 2.4 10*3/uL (ref 0.7–4.0)
Monocytes Relative: 7.1 % (ref 3.0–12.0)
Neutro Abs: 1.9 10*3/uL (ref 1.4–7.7)
Neutrophils Relative %: 40.1 % — ABNORMAL LOW (ref 43.0–77.0)
Platelets: 353 10*3/uL (ref 150.0–400.0)
RDW: 20.1 % — ABNORMAL HIGH (ref 11.5–14.6)
WBC: 4.6 10*3/uL (ref 4.5–10.5)

## 2013-09-27 LAB — POCT URINALYSIS DIPSTICK
Glucose, UA: NEGATIVE
Leukocytes, UA: NEGATIVE
Nitrite, UA: NEGATIVE
Protein, UA: NEGATIVE
Urobilinogen, UA: 0.2

## 2013-09-27 LAB — LIPID PANEL
Cholesterol: 163 mg/dL (ref 0–200)
HDL: 54.3 mg/dL (ref >39.00)
LDL Cholesterol: 96 mg/dL (ref 0–99)
Total CHOL/HDL Ratio: 3
Triglycerides: 65 mg/dL (ref 0.0–149.0)
VLDL: 13 mg/dL (ref 0.0–40.0)

## 2013-09-27 LAB — BASIC METABOLIC PANEL
BUN: 9 mg/dL (ref 6–23)
CO2: 26 mEq/L (ref 19–32)
Calcium: 9.1 mg/dL (ref 8.4–10.5)
Chloride: 106 mEq/L (ref 96–112)
Creatinine, Ser: 0.6 mg/dL (ref 0.4–1.2)

## 2013-09-27 LAB — TSH: TSH: 1.39 u[IU]/mL (ref 0.35–5.50)

## 2013-09-27 MED ORDER — ATORVASTATIN CALCIUM 40 MG PO TABS: 40.0000 mg | ORAL_TABLET | Freq: Every day | ORAL | Status: DC

## 2013-09-27 MED ORDER — FERROUS SULFATE 325 (65 FE) MG PO TABS: 325.0000 mg | ORAL_TABLET | Freq: Two times a day (BID) | ORAL | Status: DC

## 2013-09-27 MED ORDER — DICLOFENAC SODIUM 75 MG PO TBEC: 75.0000 mg | DELAYED_RELEASE_TABLET | Freq: Two times a day (BID) | ORAL | Status: DC | PRN

## 2013-09-27 NOTE — Addendum Note (Signed)
Addended by: Aniceto Boss A on: 09/27/2013 04:55 PM   Modules accepted: Orders

## 2013-09-27 NOTE — Progress Notes (Signed)
Subjective:    Patient ID: Alyssa Khan, female    DOB: 12-18-1970, 42 y.o.   MRN: [id number]  HPI 42 yr old female for a cpx. She feels well except for some pain in the left shoulder that started one month ago. No recent trauma. The pain is in the anterior shoulder and the upper arm. Advil and heat help.    Review of Systems  Constitutional: Negative.  Negative for fever, diaphoresis, activity change, appetite change, fatigue and unexpected weight change.  HENT: Negative.  Negative for congestion, ear pain, hearing loss, nosebleeds, sore throat, tinnitus, trouble swallowing and voice change.   Eyes: Negative.  Negative for photophobia, pain, discharge, redness and visual disturbance.  Respiratory: Negative.  Negative for apnea, cough, choking, chest tightness, shortness of breath, wheezing and stridor.   Cardiovascular: Negative.  Negative for chest pain, palpitations and leg swelling.  Gastrointestinal: Negative.  Negative for nausea, vomiting, abdominal pain, diarrhea, constipation, blood in stool, abdominal distention and rectal pain.  Genitourinary: Negative.  Negative for dysuria, urgency, frequency, hematuria, flank pain, vaginal bleeding, vaginal discharge, enuresis, difficulty urinating, vaginal pain and menstrual problem.  Musculoskeletal: Negative.  Negative for arthralgias, back pain, gait problem, joint swelling, myalgias, neck pain and neck stiffness.  Skin: Negative.  Negative for color change, pallor, rash and wound.  Neurological: Negative.  Negative for dizziness, tremors, seizures, syncope, speech difficulty, weakness, light-headedness, numbness and headaches.  Hematological: Negative for adenopathy. Does not bruise/bleed easily.  Psychiatric/Behavioral: Negative.  Negative for hallucinations, behavioral problems, confusion, sleep disturbance, dysphoric mood and agitation. The patient is not nervous/anxious.        Objective:   Physical Exam  Constitutional: She  appears well-developed and well-nourished. No distress.  HENT:  Head: Normocephalic and atraumatic.  Right Ear: External ear normal.  Left Ear: External ear normal.  Nose: Nose normal.  Mouth/Throat: Oropharynx is clear and moist. No oropharyngeal exudate.  Eyes: Conjunctivae and EOM are normal. Pupils are equal, round, and reactive to light. Right eye exhibits no discharge. Left eye exhibits no discharge. No scleral icterus.  Neck: Normal range of motion. Neck supple. No JVD present. No thyromegaly present.  Cardiovascular: Normal rate, regular rhythm, normal heart sounds and intact distal pulses.  Exam reveals no gallop and no friction rub.   No murmur heard. Pulmonary/Chest: Effort normal and breath sounds normal. No stridor. No respiratory distress. She has no wheezes. She has no rales. She exhibits no tenderness.  Abdominal: Soft. Normal appearance and bowel sounds are normal. She exhibits no distension, no abdominal bruit, no ascites and no mass. There is no hepatosplenomegaly. There is no tenderness. There is no rigidity, no rebound and no guarding. No hernia.  Genitourinary: Rectum normal, vagina normal and uterus normal. No breast swelling, tenderness, discharge or bleeding. Cervix exhibits no motion tenderness, no discharge and no friability. Right adnexum displays no mass, no tenderness and no fullness. Left adnexum displays no mass, no tenderness and no fullness. No erythema, tenderness or bleeding around the vagina. No vaginal discharge found.  Musculoskeletal: Normal range of motion. She exhibits no edema and no tenderness.  Lymphadenopathy:    She has no cervical adenopathy.  Neurological: She is alert. She has normal reflexes. No cranial nerve deficit. She exhibits normal muscle tone. Coordination normal.  Skin: Skin is warm and dry. No rash noted. She is not diaphoretic. No erythema. No pallor.  Psychiatric: She has a normal mood and affect. Her behavior is normal. Judgment and  thought content normal.  Assessment & Plan:  Well exam. She seems to have some bursitis in the shoulder. Try Diclofenac and rest. Get fasting labs

## 2013-09-27 NOTE — Progress Notes (Signed)
Quick Note:  I spoke with pt, sent script e-scribe ( pt requested a 90 day supply which I did ) ______

## 2013-09-27 NOTE — Addendum Note (Signed)
Addended by: Aniceto Boss A on: 09/27/2013 09:59 AM   Modules accepted: Orders

## 2013-10-03 NOTE — Progress Notes (Signed)
Quick Note:  I spoke with pt ______ 

## 2013-10-10 ENCOUNTER — Telehealth: Payer: Self-pay | Admitting: Family Medicine

## 2013-10-10 NOTE — Telephone Encounter (Signed)
Shina/Patient Phone [phone] called regarding medication side effects.  No contact at time of call back.  Left message to call back if assistance still required.

## 2013-10-14 ENCOUNTER — Telehealth: Payer: Self-pay | Admitting: Family Medicine

## 2013-10-14 NOTE — Telephone Encounter (Signed)
DICLOFENAC SODIUM (NONSTEROIDAL ANTI-INFLAMMATORY AGENTS AND ferrous sulfate 325 (65 FE) MG tablet. Patient needs to speak with you in regards to these two medications. She no longer wants to use these medications because the diclofenac gives her chest pain and feels like she is on fire and the ferrous bothers her heart, stomach and constipation occurs. And she would like to be advised on what do, because she no longer wants to take these.

## 2013-10-14 NOTE — Telephone Encounter (Signed)
Tell her to stop them both. Take an OTC iron pill daily and use Advil prn for pain.

## 2013-10-14 NOTE — Telephone Encounter (Signed)
I spoke with pt and gave below information.  

## 2013-12-18 ENCOUNTER — Ambulatory Visit (INDEPENDENT_AMBULATORY_CARE_PROVIDER_SITE_OTHER): Payer: 59 | Admitting: Family Medicine

## 2013-12-18 ENCOUNTER — Encounter: Payer: Self-pay | Admitting: Family Medicine

## 2013-12-18 VITALS — BP 124/78 | HR 76 | Temp 98.5°F | Ht 65.0 in | Wt 140.0 lb

## 2013-12-18 DIAGNOSIS — M25519 Pain in unspecified shoulder: Secondary | ICD-10-CM

## 2013-12-18 NOTE — Progress Notes (Signed)
   Subjective:    Patient ID: Alyssa Khan, female    DOB: [birth year], 43 y.o.   MRN: [id number]  HPI Here for left shoulder pain that started around 6 months ago. No hx of trauma. No neck pain and no pain in the arm or hand. Diclofenac helps but she had to stop it due to GI upset.    Review of Systems  Constitutional: Negative.   Musculoskeletal: Positive for arthralgias.       Objective:   Physical Exam  Constitutional: She appears well-developed and well-nourished.  Musculoskeletal:  Left shoulder is very tender anteriorly around the acromion. ROM is limited by pain          Assessment & Plan:  This is consistent with bursitis. Refer to Orthopedics.

## 2013-12-18 NOTE — Progress Notes (Signed)
Pre visit review using our clinic review tool, if applicable. No additional management support is needed unless otherwise documented below in the visit note. 

## 2014-01-01 ENCOUNTER — Ambulatory Visit: Payer: 59 | Attending: Sports Medicine

## 2014-01-01 DIAGNOSIS — R5381 Other malaise: Secondary | ICD-10-CM | POA: Insufficient documentation

## 2014-01-01 DIAGNOSIS — M25519 Pain in unspecified shoulder: Secondary | ICD-10-CM | POA: Insufficient documentation

## 2014-01-01 DIAGNOSIS — M25619 Stiffness of unspecified shoulder, not elsewhere classified: Secondary | ICD-10-CM | POA: Insufficient documentation

## 2014-01-01 DIAGNOSIS — IMO0001 Reserved for inherently not codable concepts without codable children: Secondary | ICD-10-CM | POA: Insufficient documentation

## 2014-01-09 ENCOUNTER — Ambulatory Visit: Payer: 59

## 2014-01-16 ENCOUNTER — Ambulatory Visit: Payer: 59 | Admitting: Physical Therapy

## 2014-01-22 ENCOUNTER — Ambulatory Visit: Payer: 59 | Admitting: Physical Therapy

## 2014-01-29 ENCOUNTER — Ambulatory Visit: Payer: 59 | Attending: Sports Medicine

## 2014-01-29 DIAGNOSIS — R5381 Other malaise: Secondary | ICD-10-CM | POA: Insufficient documentation

## 2014-01-29 DIAGNOSIS — IMO0001 Reserved for inherently not codable concepts without codable children: Secondary | ICD-10-CM | POA: Insufficient documentation

## 2014-01-29 DIAGNOSIS — M25619 Stiffness of unspecified shoulder, not elsewhere classified: Secondary | ICD-10-CM | POA: Insufficient documentation

## 2014-01-29 DIAGNOSIS — M25519 Pain in unspecified shoulder: Secondary | ICD-10-CM | POA: Insufficient documentation

## 2014-02-05 ENCOUNTER — Ambulatory Visit: Payer: 59 | Admitting: Physical Therapy

## 2014-02-12 ENCOUNTER — Ambulatory Visit: Payer: 59 | Admitting: Physical Therapy

## 2014-02-21 ENCOUNTER — Ambulatory Visit: Payer: 59 | Admitting: Physical Therapy

## 2014-07-29 ENCOUNTER — Other Ambulatory Visit: Payer: Self-pay

## 2014-07-29 DIAGNOSIS — Z1231 Encounter for screening mammogram for malignant neoplasm of breast: Secondary | ICD-10-CM

## 2014-08-12 ENCOUNTER — Ambulatory Visit
Admission: RE | Admit: 2014-08-12 | Discharge: 2014-08-12 | Disposition: A | Discharge status: Home / Self Care | Payer: 59 | Source: Ambulatory Visit

## 2014-08-12 DIAGNOSIS — Z1231 Encounter for screening mammogram for malignant neoplasm of breast: Secondary | ICD-10-CM

## 2014-08-29 ENCOUNTER — Ambulatory Visit (INDEPENDENT_AMBULATORY_CARE_PROVIDER_SITE_OTHER): Payer: 59

## 2014-08-29 ENCOUNTER — Ambulatory Visit: Payer: 59 | Admitting: Family Medicine

## 2014-08-29 DIAGNOSIS — Z23 Encounter for immunization: Secondary | ICD-10-CM

## 2014-09-29 ENCOUNTER — Ambulatory Visit (INDEPENDENT_AMBULATORY_CARE_PROVIDER_SITE_OTHER): Payer: 59 | Admitting: Family Medicine

## 2014-09-29 ENCOUNTER — Encounter: Payer: Self-pay | Admitting: Family Medicine

## 2014-09-29 VITALS — BP 142/96 | HR 78 | Temp 99.0°F | Ht 65.0 in | Wt 133.0 lb

## 2014-09-29 DIAGNOSIS — Z0000 Encounter for general adult medical examination without abnormal findings: Secondary | ICD-10-CM

## 2014-09-29 LAB — CBC WITH DIFFERENTIAL/PLATELET
BASOS ABS: 0 10*3/uL (ref 0.0–0.1)
Basophils Relative: 0.7 % (ref 0.0–3.0)
EOS PCT: 0.3 % (ref 0.0–5.0)
Eosinophils Absolute: 0 10*3/uL (ref 0.0–0.7)
HEMATOCRIT: 40.8 % (ref 36.0–46.0)
HEMOGLOBIN: 13.2 g/dL (ref 12.0–15.0)
LYMPHS PCT: 40.4 % (ref 12.0–46.0)
Lymphs Abs: 1.9 10*3/uL (ref 0.7–4.0)
MCHC: 32.3 g/dL (ref 30.0–36.0)
MCV: 79.4 fl (ref 78.0–100.0)
MONOS PCT: 4.7 % (ref 3.0–12.0)
Monocytes Absolute: 0.2 10*3/uL (ref 0.1–1.0)
NEUTROS ABS: 2.5 10*3/uL (ref 1.4–7.7)
Neutrophils Relative %: 53.9 % (ref 43.0–77.0)
Platelets: 221 10*3/uL (ref 150.0–400.0)
RBC: 5.14 Mil/uL — ABNORMAL HIGH (ref 3.87–5.11)
RDW: 16.3 % — AB (ref 11.5–15.5)
WBC: 4.7 10*3/uL (ref 4.0–10.5)

## 2014-09-29 LAB — BASIC METABOLIC PANEL
BUN: 8 mg/dL (ref 6–23)
CO2: 26 meq/L (ref 19–32)
CREATININE: 0.7 mg/dL (ref 0.4–1.2)
Calcium: 9.2 mg/dL (ref 8.4–10.5)
Chloride: 106 mEq/L (ref 96–112)
GFR: 124.72 mL/min (ref >60.00)
GLUCOSE: 81 mg/dL (ref 70–99)
Potassium: 4.4 mEq/L (ref 3.5–5.1)
Sodium: 139 mEq/L (ref 135–145)

## 2014-09-29 LAB — LIPID PANEL
CHOLESTEROL: 177 mg/dL (ref 0–200)
HDL: 63 mg/dL (ref >39.00)
LDL Cholesterol: 103 mg/dL — ABNORMAL HIGH (ref 0–99)
NonHDL: 114
Total CHOL/HDL Ratio: 3
Triglycerides: 54 mg/dL (ref 0.0–149.0)
VLDL: 10.8 mg/dL (ref 0.0–40.0)

## 2014-09-29 LAB — HEPATIC FUNCTION PANEL
ALT: 24 U/L (ref 0–35)
AST: 21 U/L (ref 0–37)
Albumin: 3.7 g/dL (ref 3.5–5.2)
Alkaline Phosphatase: 39 U/L (ref 39–117)
BILIRUBIN DIRECT: 0 mg/dL (ref 0.0–0.3)
Total Bilirubin: 0.6 mg/dL (ref 0.2–1.2)
Total Protein: 7.2 g/dL (ref 6.0–8.3)

## 2014-09-29 LAB — TSH: TSH: 1.37 u[IU]/mL (ref 0.35–4.50)

## 2014-09-29 MED ORDER — ATORVASTATIN CALCIUM 40 MG PO TABS: 40.0000 mg | ORAL_TABLET | Freq: Every day | ORAL | Status: DC

## 2014-09-29 MED ORDER — LISINOPRIL 10 MG PO TABS: 10.0000 mg | ORAL_TABLET | Freq: Every day | ORAL | Status: DC

## 2014-09-29 MED ORDER — FERROUS GLUCONATE 324 (37.5 FE) MG PO TABS: 1.0000 | ORAL_TABLET | Freq: Every day | ORAL | Status: DC

## 2014-09-29 NOTE — Progress Notes (Signed)
Pre visit review using our clinic review tool, if applicable. No additional management support is needed unless otherwise documented below in the visit note. 

## 2014-09-29 NOTE — Progress Notes (Signed)
   Subjective:    Patient ID: Alyssa Khan, female    DOB: 1971-07-15, 43 y.o.   MRN: [id number]  HPI 43 yr old female for a cpx. She feels well.    Review of Systems  Constitutional: Negative.   HENT: Negative.   Eyes: Negative.   Respiratory: Negative.   Cardiovascular: Negative.   Gastrointestinal: Negative.   Genitourinary: Negative for dysuria, urgency, frequency, hematuria, flank pain, decreased urine volume, enuresis, difficulty urinating, pelvic pain and dyspareunia.  Musculoskeletal: Negative.   Skin: Negative.   Neurological: Negative.   Psychiatric/Behavioral: Negative.        Objective:   Physical Exam  Constitutional: She is oriented to person, place, and time. She appears well-developed and well-nourished. No distress.  HENT:  Head: Normocephalic and atraumatic.  Right Ear: External ear normal.  Left Ear: External ear normal.  Nose: Nose normal.  Mouth/Throat: Oropharynx is clear and moist. No oropharyngeal exudate.  Eyes: Conjunctivae and EOM are normal. Pupils are equal, round, and reactive to light. No scleral icterus.  Neck: Normal range of motion. Neck supple. No JVD present. No thyromegaly present.  Cardiovascular: Normal rate, regular rhythm, normal heart sounds and intact distal pulses.  Exam reveals no gallop and no friction rub.   No murmur heard. Pulmonary/Chest: Effort normal and breath sounds normal. No respiratory distress. She has no wheezes. She has no rales. She exhibits no tenderness.  Abdominal: Soft. Bowel sounds are normal. She exhibits no distension and no mass. There is no tenderness. There is no rebound and no guarding.  Musculoskeletal: Normal range of motion. She exhibits no edema or tenderness.  Lymphadenopathy:    She has no cervical adenopathy.  Neurological: She is alert and oriented to person, place, and time. She has normal reflexes. No cranial nerve deficit. She exhibits normal muscle tone. Coordination normal.  Skin: Skin  is warm and dry. No rash noted. No erythema.  Psychiatric: She has a normal mood and affect. Her behavior is normal. Judgment and thought content normal.          Assessment & Plan:  Well exam. Get fasting labs. She is on her menses today so she will reschedule the Pap smear for later.

## 2014-10-13 ENCOUNTER — Telehealth: Payer: Self-pay | Admitting: Family Medicine

## 2014-10-13 ENCOUNTER — Other Ambulatory Visit (HOSPITAL_COMMUNITY)
Admission: RE | Admit: 2014-10-13 | Discharge: 2014-10-13 | Disposition: A | Discharge status: Home / Self Care | Payer: 59 | Source: Ambulatory Visit | Attending: Family Medicine | Admitting: Family Medicine

## 2014-10-13 ENCOUNTER — Encounter: Payer: Self-pay | Admitting: Family Medicine

## 2014-10-13 ENCOUNTER — Ambulatory Visit (INDEPENDENT_AMBULATORY_CARE_PROVIDER_SITE_OTHER): Payer: 59 | Admitting: Family Medicine

## 2014-10-13 VITALS — BP 130/90 | HR 64 | Temp 98.2°F | Ht 65.0 in | Wt 133.0 lb

## 2014-10-13 DIAGNOSIS — Z01419 Encounter for gynecological examination (general) (routine) without abnormal findings: Secondary | ICD-10-CM | POA: Diagnosis not present

## 2014-10-13 DIAGNOSIS — N760 Acute vaginitis: Secondary | ICD-10-CM

## 2014-10-13 DIAGNOSIS — Z0000 Encounter for general adult medical examination without abnormal findings: Secondary | ICD-10-CM

## 2014-10-13 LAB — POCT URINALYSIS DIPSTICK
Bilirubin, UA: NEGATIVE
Glucose, UA: NEGATIVE
KETONES UA: NEGATIVE
Leukocytes, UA: NEGATIVE
Nitrite, UA: NEGATIVE
PROTEIN UA: NEGATIVE
RBC UA: NEGATIVE
SPEC GRAV UA: 1.02
Urobilinogen, UA: 0.2
pH, UA: 7

## 2014-10-13 MED ORDER — OMEPRAZOLE 20 MG PO CPDR: 20.0000 mg | DELAYED_RELEASE_CAPSULE | Freq: Every day | ORAL | Status: DC

## 2014-10-13 MED ORDER — FLUCONAZOLE 150 MG PO TABS: 150.0000 mg | ORAL_TABLET | Freq: Once | ORAL | Status: DC

## 2014-10-13 NOTE — Telephone Encounter (Signed)
Pt was seen today and would like a rx for iron pill sent to [facility].

## 2014-10-13 NOTE — Addendum Note (Signed)
Addended by: Aggie Hacker A on: 10/13/2014 10:08 AM   Modules accepted: Orders

## 2014-10-13 NOTE — Progress Notes (Signed)
   Subjective:    Patient ID: Alyssa Khan, female    DOB: [birth year], 43 y.o.   MRN: [id number]  HPI Here for a Pap smear. She had a cpx on 09-29-14 but she was menstruating that day. Her cycles are normal. She has had a slight vaginal DC for the past week.    Review of Systems  Constitutional: Negative.   Genitourinary: Positive for vaginal discharge. Negative for vaginal pain and pelvic pain.       Objective:   Physical Exam  Constitutional: She appears well-developed and well-nourished.  Abdominal: Soft. Bowel sounds are normal. She exhibits no distension and no mass. There is no tenderness. There is no rebound and no guarding.  Genitourinary: Uterus normal. No breast swelling, tenderness, discharge or bleeding. There is no rash, tenderness, lesion or injury on the right labia. There is no rash, tenderness, lesion or injury on the left labia.  White odorless vaginal DC present           Assessment & Plan:  The Pap smear was obtained. Treat the yeast infection with Diflucan.

## 2014-10-13 NOTE — Progress Notes (Signed)
Pre visit review using our clinic review tool, if applicable. No additional management support is needed unless otherwise documented below in the visit note. 

## 2014-10-14 ENCOUNTER — Telehealth: Payer: Self-pay | Admitting: Family Medicine

## 2014-10-14 NOTE — Telephone Encounter (Signed)
I left a detailed voice message, this script was sent to [facility] on 09/29/14 for # 90 with 3 refills.

## 2014-10-14 NOTE — Telephone Encounter (Signed)
I don't know of a better iron supplement to take but using prilosec with it will hellp a lot. Call Prilosec 40 mg daily, one year supply

## 2014-10-14 NOTE — Telephone Encounter (Signed)
Also is there another iron substitute other than the one we sent in already to the pharmacy?

## 2014-10-14 NOTE — Telephone Encounter (Signed)
Pt states she cannot take the previous iron pill dr fry prescribed.(gives her heartburn) dr fry wrote down prilosec but and it is very costly. Pharm told pt that she can get an rx for this. Pt would like that called into [facility].

## 2014-10-15 LAB — CYTOLOGY - PAP

## 2014-10-15 MED ORDER — OMEPRAZOLE 40 MG PO CPDR: 40.0000 mg | DELAYED_RELEASE_CAPSULE | Freq: Every day | ORAL | Status: DC

## 2014-10-15 NOTE — Telephone Encounter (Signed)
I sent script e-scribe for Prilosec.

## 2014-10-16 ENCOUNTER — Encounter: Payer: Self-pay | Admitting: Family Medicine

## 2014-11-26 ENCOUNTER — Ambulatory Visit (INDEPENDENT_AMBULATORY_CARE_PROVIDER_SITE_OTHER): Payer: 59 | Admitting: Family Medicine

## 2014-11-26 ENCOUNTER — Encounter: Payer: Self-pay | Admitting: Family Medicine

## 2014-11-26 VITALS — BP 127/95 | HR 94 | Temp 99.0°F | Ht 65.0 in | Wt 131.0 lb

## 2014-11-26 DIAGNOSIS — J209 Acute bronchitis, unspecified: Secondary | ICD-10-CM

## 2014-11-26 MED ORDER — AZITHROMYCIN 250 MG PO TABS: ORAL_TABLET | ORAL | Status: DC

## 2014-11-26 MED ORDER — HYDROCODONE-HOMATROPINE 5-1.5 MG/5ML PO SYRP: 5.0000 mL | ORAL_SOLUTION | ORAL | Status: DC | PRN

## 2014-11-26 NOTE — Progress Notes (Signed)
Pre visit review using our clinic review tool, if applicable. No additional management support is needed unless otherwise documented below in the visit note. 

## 2014-11-26 NOTE — Progress Notes (Signed)
   Subjective:    Patient ID: Alyssa Khan, female    DOB: 05/06/1971, 43 y.o.   MRN: [id number]  HPI Here for 2 weeks of PND, chest congestion and coughing up green sputum.    Review of Systems  Constitutional: Negative.   HENT: Positive for congestion, postnasal drip and sinus pressure.   Eyes: Negative.   Respiratory: Positive for cough.        Objective:   Physical Exam  Constitutional: She appears well-developed and well-nourished.  HENT:  Right Ear: External ear normal.  Left Ear: External ear normal.  Nose: Nose normal.  Mouth/Throat: Oropharynx is clear and moist.  Eyes: Conjunctivae are normal.  Pulmonary/Chest: Effort normal. No respiratory distress. She has no wheezes. She has no rales.  Scattered rhonchi  Lymphadenopathy:    She has no cervical adenopathy.          Assessment & Plan:  Add Mucinex

## 2014-12-18 ENCOUNTER — Ambulatory Visit: Payer: 59 | Admitting: Family Medicine

## 2014-12-19 ENCOUNTER — Ambulatory Visit: Payer: 59 | Admitting: Family Medicine

## 2014-12-22 ENCOUNTER — Ambulatory Visit (INDEPENDENT_AMBULATORY_CARE_PROVIDER_SITE_OTHER): Payer: 59 | Admitting: Family Medicine

## 2014-12-22 ENCOUNTER — Encounter: Payer: Self-pay | Admitting: Family Medicine

## 2014-12-22 VITALS — BP 122/91 | HR 68 | Temp 99.3°F | Wt 130.0 lb

## 2014-12-22 DIAGNOSIS — J0100 Acute maxillary sinusitis, unspecified: Secondary | ICD-10-CM

## 2014-12-22 DIAGNOSIS — R05 Cough: Secondary | ICD-10-CM

## 2014-12-22 DIAGNOSIS — I10 Essential (primary) hypertension: Secondary | ICD-10-CM | POA: Insufficient documentation

## 2014-12-22 DIAGNOSIS — T464X5A Adverse effect of angiotensin-converting-enzyme inhibitors, initial encounter: Secondary | ICD-10-CM

## 2014-12-22 MED ORDER — AMOXICILLIN-POT CLAVULANATE 875-125 MG PO TABS: 1.0000 | ORAL_TABLET | Freq: Two times a day (BID) | ORAL | Status: DC

## 2014-12-22 MED ORDER — AMLODIPINE BESYLATE 5 MG PO TABS: 5.0000 mg | ORAL_TABLET | Freq: Every day | ORAL | Status: DC

## 2014-12-22 NOTE — Progress Notes (Signed)
Pre visit review using our clinic review tool, if applicable. No additional management support is needed unless otherwise documented below in the visit note. 

## 2014-12-22 NOTE — Progress Notes (Signed)
   Subjective:    Patient ID: Alyssa Khan, female    DOB: 10-17-1971, 44 y.o.   MRN: [id number]  HPI Here for continuing sx including a cough that has not responded to taking a Zpack. She still has a mild HA and sinus congestion, and she still has a dry cough. However the cough has changed in the last week to be a tickling cough high up in the throat. She has no chest sx at all. She has been taking Lisinopril for 2-3 months for HTN.    Review of Systems  Constitutional: Negative.   HENT: Positive for congestion, postnasal drip and sinus pressure.   Eyes: Negative.   Respiratory: Positive for cough. Negative for chest tightness, shortness of breath and wheezing.        Objective:   Physical Exam  Constitutional: She appears well-developed and well-nourished. No distress.  HENT:  Right Ear: External ear normal.  Left Ear: External ear normal.  Nose: Nose normal.  Mouth/Throat: Oropharynx is clear and moist.  Eyes: Conjunctivae are normal.  Cardiovascular: Normal rate, regular rhythm, normal heart sounds and intact distal pulses.   Pulmonary/Chest: Effort normal and breath sounds normal. No respiratory distress. She has no wheezes. She has no rales. She exhibits no tenderness.  Lymphadenopathy:    She has no cervical adenopathy.          Assessment & Plan:  It seems that she has two issues going on right now. She has a partially treated sinusitis, so we will put her on 10 days of Augmentin. Also her cough now sounds like it is related to her ACE inhibitor, so we will switch from Lisinopril to Amlodipine. Recheck in one month.

## 2015-01-15 ENCOUNTER — Telehealth: Payer: Self-pay | Admitting: Family Medicine

## 2015-01-15 NOTE — Telephone Encounter (Signed)
She should continue on the Amlodipine since it is impossible for this to cause a cough

## 2015-01-15 NOTE — Telephone Encounter (Signed)
Pt has stopped all of her medications due to her cough. She wants to know if she should start another blood pressure medication?

## 2015-01-16 NOTE — Telephone Encounter (Signed)
I spoke with pt and she will start back on the Amlodipine for a few days and see if she starts to cough again. She will call the office back if this happens again.

## 2015-02-17 ENCOUNTER — Telehealth: Payer: Self-pay | Admitting: Family Medicine

## 2015-02-17 NOTE — Telephone Encounter (Signed)
Pt request refill of the following: amLODipine (NORVASC) 5 MG tablet   Phamacy: [facility]

## 2015-02-18 MED ORDER — AMLODIPINE BESYLATE 5 MG PO TABS: 5.0000 mg | ORAL_TABLET | Freq: Every day | ORAL | Status: DC

## 2015-02-18 NOTE — Telephone Encounter (Signed)
Rx sent to pharmacy   

## 2015-05-28 ENCOUNTER — Other Ambulatory Visit: Payer: Self-pay

## 2015-05-28 MED ORDER — ATORVASTATIN CALCIUM 40 MG PO TABS: 40.0000 mg | ORAL_TABLET | Freq: Every day | ORAL | Status: DC

## 2015-07-28 ENCOUNTER — Other Ambulatory Visit: Payer: Self-pay

## 2015-07-28 DIAGNOSIS — Z1231 Encounter for screening mammogram for malignant neoplasm of breast: Secondary | ICD-10-CM

## 2015-07-30 ENCOUNTER — Telehealth: Payer: Self-pay

## 2015-07-30 DIAGNOSIS — Z209 Contact with and (suspected) exposure to unspecified communicable disease: Secondary | ICD-10-CM

## 2015-07-30 NOTE — Telephone Encounter (Signed)
The patient called and stated she believes she is suppose to have an xray done.  However, i don't see an order, and she is hoping to confirm when and where she should do this xray.

## 2015-07-31 NOTE — Telephone Encounter (Signed)
I have no idea what she is talking about. Please get more information

## 2015-07-31 NOTE — Telephone Encounter (Signed)
Pt has to have Xray for work, she cannot do the TB skin test.

## 2015-08-04 NOTE — Telephone Encounter (Signed)
I spoke with pt  

## 2015-08-04 NOTE — Telephone Encounter (Signed)
I believe she has received the BCG immunization in the past. I put in the order for a CXR anytime at [facility]

## 2015-08-05 ENCOUNTER — Ambulatory Visit (INDEPENDENT_AMBULATORY_CARE_PROVIDER_SITE_OTHER)
Admission: RE | Admit: 2015-08-05 | Discharge: 2015-08-05 | Disposition: A | Discharge status: Home / Self Care | Payer: 59 | Source: Ambulatory Visit | Attending: Family Medicine | Admitting: Family Medicine

## 2015-08-05 DIAGNOSIS — Z209 Contact with and (suspected) exposure to unspecified communicable disease: Secondary | ICD-10-CM | POA: Diagnosis not present

## 2015-08-06 ENCOUNTER — Telehealth: Payer: Self-pay | Admitting: Family Medicine

## 2015-08-06 NOTE — Telephone Encounter (Signed)
I spoke with pt and gave results.  

## 2015-08-06 NOTE — Telephone Encounter (Signed)
Pt would like results of xray done yesterday. Pt states she was to have a call this am.

## 2015-08-19 ENCOUNTER — Ambulatory Visit
Admission: RE | Admit: 2015-08-19 | Discharge: 2015-08-19 | Disposition: A | Discharge status: Home / Self Care | Payer: 59 | Source: Ambulatory Visit

## 2015-08-19 DIAGNOSIS — Z1231 Encounter for screening mammogram for malignant neoplasm of breast: Secondary | ICD-10-CM

## 2015-08-20 ENCOUNTER — Ambulatory Visit (INDEPENDENT_AMBULATORY_CARE_PROVIDER_SITE_OTHER): Payer: 59 | Admitting: Family Medicine

## 2015-08-20 DIAGNOSIS — Z23 Encounter for immunization: Secondary | ICD-10-CM

## 2015-08-28 ENCOUNTER — Ambulatory Visit (INDEPENDENT_AMBULATORY_CARE_PROVIDER_SITE_OTHER): Payer: 59 | Admitting: Family Medicine

## 2015-08-28 ENCOUNTER — Encounter: Payer: Self-pay | Admitting: Family Medicine

## 2015-08-28 VITALS — BP 116/85 | HR 77 | Temp 98.7°F | Ht 65.0 in | Wt 131.0 lb

## 2015-08-28 DIAGNOSIS — S0083XA Contusion of other part of head, initial encounter: Secondary | ICD-10-CM | POA: Diagnosis not present

## 2015-08-28 DIAGNOSIS — S161XXA Strain of muscle, fascia and tendon at neck level, initial encounter: Secondary | ICD-10-CM | POA: Diagnosis not present

## 2015-08-28 NOTE — Progress Notes (Signed)
Pre visit review using our clinic review tool, if applicable. No additional management support is needed unless otherwise documented below in the visit note. 
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
## 2015-10-02 ENCOUNTER — Encounter: Payer: Self-pay | Admitting: Family Medicine

## 2015-10-02 ENCOUNTER — Ambulatory Visit (INDEPENDENT_AMBULATORY_CARE_PROVIDER_SITE_OTHER): Payer: 59 | Admitting: Family Medicine

## 2015-10-02 ENCOUNTER — Other Ambulatory Visit (HOSPITAL_COMMUNITY)
Admission: RE | Admit: 2015-10-02 | Discharge: 2015-10-02 | Disposition: A | Discharge status: Home / Self Care | Payer: 59 | Source: Ambulatory Visit | Attending: Family Medicine | Admitting: Family Medicine

## 2015-10-02 VITALS — BP 123/86 | HR 67 | Temp 99.0°F | Ht 65.0 in | Wt 132.0 lb

## 2015-10-02 DIAGNOSIS — Z0000 Encounter for general adult medical examination without abnormal findings: Secondary | ICD-10-CM | POA: Diagnosis not present

## 2015-10-02 DIAGNOSIS — Z01419 Encounter for gynecological examination (general) (routine) without abnormal findings: Secondary | ICD-10-CM | POA: Insufficient documentation

## 2015-10-02 LAB — CBC WITH DIFFERENTIAL/PLATELET
BASOS ABS: 0 10*3/uL (ref 0.0–0.1)
Basophils Relative: 0.5 % (ref 0.0–3.0)
EOS PCT: 0.5 % (ref 0.0–5.0)
Eosinophils Absolute: 0 10*3/uL (ref 0.0–0.7)
HEMATOCRIT: 41.3 % (ref 36.0–46.0)
HEMOGLOBIN: 13.4 g/dL (ref 12.0–15.0)
LYMPHS PCT: 49.9 % — AB (ref 12.0–46.0)
Lymphs Abs: 2 10*3/uL (ref 0.7–4.0)
MCHC: 32.6 g/dL (ref 30.0–36.0)
MCV: 82.7 fl (ref 78.0–100.0)
MONOS PCT: 4.3 % (ref 3.0–12.0)
Monocytes Absolute: 0.2 10*3/uL (ref 0.1–1.0)
Neutro Abs: 1.8 10*3/uL (ref 1.4–7.7)
Neutrophils Relative %: 44.8 % (ref 43.0–77.0)
Platelets: 224 10*3/uL (ref 150.0–400.0)
RBC: 4.99 Mil/uL (ref 3.87–5.11)
RDW: 13.4 % (ref 11.5–15.5)
WBC: 3.9 10*3/uL — ABNORMAL LOW (ref 4.0–10.5)

## 2015-10-02 LAB — BASIC METABOLIC PANEL
BUN: 10 mg/dL (ref 6–23)
CALCIUM: 9.3 mg/dL (ref 8.4–10.5)
CO2: 29 meq/L (ref 19–32)
CREATININE: 0.57 mg/dL (ref 0.40–1.20)
Chloride: 104 mEq/L (ref 96–112)
GFR: 149.56 mL/min (ref >60.00)
Glucose, Bld: 79 mg/dL (ref 70–99)
Potassium: 4.3 mEq/L (ref 3.5–5.1)
Sodium: 140 mEq/L (ref 135–145)

## 2015-10-02 LAB — POCT URINALYSIS DIPSTICK
Bilirubin, UA: NEGATIVE
Blood, UA: NEGATIVE
GLUCOSE UA: NEGATIVE
Ketones, UA: NEGATIVE
LEUKOCYTES UA: NEGATIVE
Nitrite, UA: NEGATIVE
PROTEIN UA: NEGATIVE
Spec Grav, UA: 1.02
UROBILINOGEN UA: 0.2
pH, UA: 7

## 2015-10-02 LAB — LIPID PANEL
CHOL/HDL RATIO: 3
Cholesterol: 154 mg/dL (ref 0–200)
HDL: 54.7 mg/dL (ref >39.00)
LDL CALC: 91 mg/dL (ref 0–99)
NONHDL: 99.2
Triglycerides: 39 mg/dL (ref 0.0–149.0)
VLDL: 7.8 mg/dL (ref 0.0–40.0)

## 2015-10-02 LAB — HEPATIC FUNCTION PANEL
ALT: 25 U/L (ref 0–35)
AST: 18 U/L (ref 0–37)
Albumin: 4.4 g/dL (ref 3.5–5.2)
Alkaline Phosphatase: 41 U/L (ref 39–117)
BILIRUBIN DIRECT: 0.1 mg/dL (ref 0.0–0.3)
TOTAL PROTEIN: 6.8 g/dL (ref 6.0–8.3)
Total Bilirubin: 0.4 mg/dL (ref 0.2–1.2)

## 2015-10-02 LAB — TSH: TSH: 0.86 u[IU]/mL (ref 0.35–4.50)

## 2015-10-02 MED ORDER — AMLODIPINE BESYLATE 5 MG PO TABS: 5.0000 mg | ORAL_TABLET | Freq: Every day | ORAL | Status: DC

## 2015-10-02 MED ORDER — ATORVASTATIN CALCIUM 40 MG PO TABS: 40.0000 mg | ORAL_TABLET | Freq: Every day | ORAL | Status: DC

## 2015-10-02 NOTE — Progress Notes (Signed)
Subjective:    Patient ID: Alyssa Khan, female    DOB: Mar 07, 1971, 44 y.o.   MRN: [id number]  HPI 44 yr old female for a cpx. Se feels fine. Her menses are regular, but they are a little longer and heavier than they used to be. She now averages 7 days per cycle. She asks me to check a lump on er finger that appeared one year ago. It does not bother her and it has not changed in months. Her BP is stable.    Review of Systems  Constitutional: Negative.  Negative for fever, diaphoresis, activity change, appetite change, fatigue and unexpected weight change.  HENT: Negative.  Negative for congestion, ear pain, hearing loss, nosebleeds, sore throat, tinnitus, trouble swallowing and voice change.   Eyes: Negative.  Negative for photophobia, pain, discharge, redness and visual disturbance.  Respiratory: Negative.  Negative for apnea, cough, choking, chest tightness, shortness of breath, wheezing and stridor.   Cardiovascular: Negative.  Negative for chest pain, palpitations and leg swelling.  Gastrointestinal: Negative.  Negative for nausea, vomiting, abdominal pain, diarrhea, constipation, blood in stool, abdominal distention and rectal pain.  Genitourinary: Negative.  Negative for dysuria, urgency, frequency, hematuria, flank pain, vaginal bleeding, vaginal discharge, enuresis, difficulty urinating, vaginal pain and menstrual problem.  Musculoskeletal: Negative.  Negative for myalgias, back pain, joint swelling, arthralgias, gait problem, neck pain and neck stiffness.  Skin: Negative.  Negative for color change, pallor, rash and wound.  Neurological: Negative.  Negative for dizziness, tremors, seizures, syncope, speech difficulty, weakness, light-headedness, numbness and headaches.  Hematological: Negative for adenopathy. Does not bruise/bleed easily.  Psychiatric/Behavioral: Negative.  Negative for hallucinations, behavioral problems, confusion, sleep disturbance, dysphoric mood and  agitation. The patient is not nervous/anxious.        Objective:   Physical Exam  Constitutional: She appears well-developed and well-nourished. No distress.  HENT:  Head: Normocephalic and atraumatic.  Right Ear: External ear normal.  Left Ear: External ear normal.  Nose: Nose normal.  Mouth/Throat: Oropharynx is clear and moist. No oropharyngeal exudate.  Eyes: Conjunctivae and EOM are normal. Pupils are equal, round, and reactive to light. Right eye exhibits no discharge. Left eye exhibits no discharge. No scleral icterus.  Neck: Normal range of motion. Neck supple. No JVD present. No thyromegaly present.  Cardiovascular: Normal rate, regular rhythm, normal heart sounds and intact distal pulses.  Exam reveals no gallop and no friction rub.   No murmur heard. Pulmonary/Chest: Effort normal and breath sounds normal. No stridor. No respiratory distress. She has no wheezes. She has no rales. She exhibits no tenderness.  Abdominal: Soft. Normal appearance and bowel sounds are normal. She exhibits no distension, no abdominal bruit, no ascites and no mass. There is no hepatosplenomegaly. There is no tenderness. There is no rigidity, no rebound and no guarding. No hernia.  Genitourinary: Rectum normal, vagina normal and uterus normal. No breast swelling, tenderness, discharge or bleeding. Cervix exhibits no motion tenderness, no discharge and no friability. Right adnexum displays no mass, no tenderness and no fullness. Left adnexum displays no mass, no tenderness and no fullness. No erythema, tenderness or bleeding in the vagina. No vaginal discharge found.  Musculoskeletal: Normal range of motion. She exhibits no edema or tenderness.  Lymphadenopathy:    She has no cervical adenopathy.  Neurological: She is alert. She has normal reflexes. No cranial nerve deficit. She exhibits normal muscle tone. Coordination normal.  Skin: Skin is warm and dry. No rash noted. She is not  diaphoretic. No erythema.  No pallor.  The right 5th finger has a soft fleshy mass beside the nail which does not encroach onto the nail. No tenderness  Psychiatric: She has a normal mood and affect. Her behavior is normal. Judgment and thought content normal.          Assessment & Plan:  Well exam. Get fasting labs. We discussed diet and exercise advice. She has a small lipoma on the finger. We will observe this only.

## 2015-10-02 NOTE — Addendum Note (Signed)
Addended by: Aggie Hacker A on: 10/02/2015 10:42 AM   Modules accepted: Orders

## 2015-10-02 NOTE — Progress Notes (Signed)
Pre visit review using our clinic review tool, if applicable. No additional management support is needed unless otherwise documented below in the visit note. 

## 2015-10-05 LAB — CYTOLOGY - PAP

## 2016-06-03 MED FILL — AMLODIPINE BESYLATE 5 MG TA: 5 | 90 days supply | Qty: 90 | Fill #1

## 2016-06-03 MED FILL — ATORVASTATIN 40 MG TABLET: 40 | 90 days supply | Qty: 90 | Fill #1

## 2016-06-30 DIAGNOSIS — H5203 Hypermetropia, bilateral: Secondary | ICD-10-CM | POA: Diagnosis not present

## 2016-06-30 DIAGNOSIS — H52223 Regular astigmatism, bilateral: Secondary | ICD-10-CM | POA: Diagnosis not present

## 2016-07-13 ENCOUNTER — Other Ambulatory Visit: Payer: Self-pay | Admitting: Family Medicine

## 2016-07-13 DIAGNOSIS — Z1231 Encounter for screening mammogram for malignant neoplasm of breast: Secondary | ICD-10-CM

## 2016-08-19 ENCOUNTER — Ambulatory Visit: Payer: 59

## 2016-08-23 ENCOUNTER — Ambulatory Visit (INDEPENDENT_AMBULATORY_CARE_PROVIDER_SITE_OTHER): Payer: 59 | Admitting: Family Medicine

## 2016-08-23 DIAGNOSIS — Z23 Encounter for immunization: Secondary | ICD-10-CM | POA: Diagnosis not present

## 2016-10-05 ENCOUNTER — Ambulatory Visit (INDEPENDENT_AMBULATORY_CARE_PROVIDER_SITE_OTHER): Payer: 59 | Admitting: Family Medicine

## 2016-10-05 ENCOUNTER — Other Ambulatory Visit (HOSPITAL_COMMUNITY)
Admission: RE | Admit: 2016-10-05 | Discharge: 2016-10-05 | Disposition: A | Discharge status: Home / Self Care | Payer: 59 | Source: Ambulatory Visit | Attending: Family Medicine | Admitting: Family Medicine

## 2016-10-05 ENCOUNTER — Encounter: Payer: Self-pay | Admitting: Family Medicine

## 2016-10-05 VITALS — BP 138/92 | HR 62 | Temp 98.6°F | Ht 65.0 in | Wt 132.6 lb

## 2016-10-05 DIAGNOSIS — Z01419 Encounter for gynecological examination (general) (routine) without abnormal findings: Secondary | ICD-10-CM | POA: Insufficient documentation

## 2016-10-05 DIAGNOSIS — Z0000 Encounter for general adult medical examination without abnormal findings: Secondary | ICD-10-CM | POA: Diagnosis not present

## 2016-10-05 LAB — TSH: TSH: 1.07 u[IU]/mL (ref 0.35–4.50)

## 2016-10-05 LAB — CBC WITH DIFFERENTIAL/PLATELET
BASOS ABS: 0 10*3/uL (ref 0.0–0.1)
Basophils Relative: 0.5 % (ref 0.0–3.0)
Eosinophils Absolute: 0 10*3/uL (ref 0.0–0.7)
Eosinophils Relative: 0.5 % (ref 0.0–5.0)
HEMATOCRIT: 39.3 % (ref 36.0–46.0)
HEMOGLOBIN: 12.9 g/dL (ref 12.0–15.0)
LYMPHS PCT: 45.9 % (ref 12.0–46.0)
Lymphs Abs: 1.9 10*3/uL (ref 0.7–4.0)
MCHC: 32.7 g/dL (ref 30.0–36.0)
MCV: 82.4 fl (ref 78.0–100.0)
MONOS PCT: 5.6 % (ref 3.0–12.0)
Monocytes Absolute: 0.2 10*3/uL (ref 0.1–1.0)
NEUTROS PCT: 47.5 % (ref 43.0–77.0)
Neutro Abs: 2 10*3/uL (ref 1.4–7.7)
Platelets: 238 10*3/uL (ref 150.0–400.0)
RBC: 4.77 Mil/uL (ref 3.87–5.11)
RDW: 14.3 % (ref 11.5–15.5)
WBC: 4.2 10*3/uL (ref 4.0–10.5)

## 2016-10-05 LAB — POC URINALSYSI DIPSTICK (AUTOMATED)
Bilirubin, UA: NEGATIVE
Blood, UA: NEGATIVE
Glucose, UA: NEGATIVE
Ketones, UA: NEGATIVE
LEUKOCYTES UA: NEGATIVE
NITRITE UA: NEGATIVE
PH UA: 7
PROTEIN UA: NEGATIVE
Spec Grav, UA: 1.02
Urobilinogen, UA: 0.2

## 2016-10-05 LAB — HEPATIC FUNCTION PANEL
ALT: 21 U/L (ref 0–35)
AST: 17 U/L (ref 0–37)
Albumin: 4.4 g/dL (ref 3.5–5.2)
Alkaline Phosphatase: 45 U/L (ref 39–117)
BILIRUBIN DIRECT: 0.1 mg/dL (ref 0.0–0.3)
BILIRUBIN TOTAL: 0.3 mg/dL (ref 0.2–1.2)
Total Protein: 6.7 g/dL (ref 6.0–8.3)

## 2016-10-05 LAB — LIPID PANEL
CHOLESTEROL: 176 mg/dL (ref 0–200)
HDL: 70.3 mg/dL (ref >39.00)
LDL CALC: 92 mg/dL (ref 0–99)
NonHDL: 106.17
Total CHOL/HDL Ratio: 3
Triglycerides: 71 mg/dL (ref 0.0–149.0)
VLDL: 14.2 mg/dL (ref 0.0–40.0)

## 2016-10-05 LAB — BASIC METABOLIC PANEL
BUN: 12 mg/dL (ref 6–23)
CO2: 29 mEq/L (ref 19–32)
CREATININE: 0.63 mg/dL (ref 0.40–1.20)
Calcium: 9.2 mg/dL (ref 8.4–10.5)
Chloride: 107 mEq/L (ref 96–112)
GFR: 132.61 mL/min (ref >60.00)
Glucose, Bld: 86 mg/dL (ref 70–99)
POTASSIUM: 4.1 meq/L (ref 3.5–5.1)
Sodium: 140 mEq/L (ref 135–145)

## 2016-10-05 MED ORDER — ATORVASTATIN CALCIUM 40 MG PO TABS: 40.0000 mg | ORAL_TABLET | Freq: Every day | ORAL | 3 refills | Status: DC

## 2016-10-05 MED ORDER — AMLODIPINE BESYLATE 5 MG PO TABS: 5.0000 mg | ORAL_TABLET | Freq: Every day | ORAL | 3 refills | Status: DC

## 2016-10-05 NOTE — Progress Notes (Signed)
Pre visit review using our clinic review tool, if applicable. No additional management support is needed unless otherwise documented below in the visit note. 

## 2016-10-05 NOTE — Progress Notes (Signed)
Subjective:    Patient ID: Alyssa Khan, female    DOB: 1971/06/27, 45 y.o.   MRN: [id number]  HPI 45 yr old female for a well exam. She feels well. She still takes an OTC iron pil every day. She has a mammogram coming up soon. Her menses are regular, lasting 7 days per month.    Review of Systems  Constitutional: Negative.  Negative for activity change, appetite change, diaphoresis, fatigue, fever and unexpected weight change.  HENT: Negative.  Negative for congestion, ear pain, hearing loss, nosebleeds, sore throat, tinnitus, trouble swallowing and voice change.   Eyes: Negative.  Negative for photophobia, pain, discharge, redness and visual disturbance.  Respiratory: Negative.  Negative for apnea, cough, choking, chest tightness, shortness of breath, wheezing and stridor.   Cardiovascular: Negative.  Negative for chest pain, palpitations and leg swelling.  Gastrointestinal: Negative.  Negative for abdominal distention, abdominal pain, blood in stool, constipation, diarrhea, nausea, rectal pain and vomiting.  Genitourinary: Negative.  Negative for decreased urine volume, difficulty urinating, dyspareunia, dysuria, enuresis, flank pain, frequency, hematuria, menstrual problem, pelvic pain, urgency, vaginal bleeding, vaginal discharge and vaginal pain.  Musculoskeletal: Negative.  Negative for arthralgias, back pain, gait problem, joint swelling, myalgias, neck pain and neck stiffness.  Skin: Negative.  Negative for color change, pallor, rash and wound.  Neurological: Negative.  Negative for dizziness, tremors, seizures, syncope, speech difficulty, weakness, light-headedness, numbness and headaches.  Hematological: Negative for adenopathy. Does not bruise/bleed easily.  Psychiatric/Behavioral: Negative.  Negative for agitation, behavioral problems, confusion, dysphoric mood, hallucinations and sleep disturbance. The patient is not nervous/anxious.        Objective:   Physical Exam    Constitutional: She appears well-developed and well-nourished. No distress.  HENT:  Head: Normocephalic and atraumatic.  Right Ear: External ear normal.  Left Ear: External ear normal.  Nose: Nose normal.  Mouth/Throat: Oropharynx is clear and moist. No oropharyngeal exudate.  Eyes: Conjunctivae and EOM are normal. Pupils are equal, round, and reactive to light. Right eye exhibits no discharge. Left eye exhibits no discharge. No scleral icterus.  Neck: Normal range of motion. Neck supple. No JVD present. No thyromegaly present.  Cardiovascular: Normal rate, regular rhythm, normal heart sounds and intact distal pulses.  Exam reveals no gallop and no friction rub.   No murmur heard. Pulmonary/Chest: Effort normal and breath sounds normal. No stridor. No respiratory distress. She has no wheezes. She has no rales. She exhibits no tenderness.  Abdominal: Soft. Normal appearance and bowel sounds are normal. She exhibits no distension, no abdominal bruit, no ascites and no mass. There is no hepatosplenomegaly. There is no tenderness. There is no rigidity, no rebound and no guarding. No hernia.  Genitourinary: Rectum normal, vagina normal and uterus normal. No breast swelling, tenderness, discharge or bleeding. Cervix exhibits no motion tenderness, no discharge and no friability. Right adnexum displays no mass, no tenderness and no fullness. Left adnexum displays no mass, no tenderness and no fullness. No erythema, tenderness or bleeding in the vagina. No vaginal discharge found.  Musculoskeletal: Normal range of motion. She exhibits no edema or tenderness.  Lymphadenopathy:    She has no cervical adenopathy.  Neurological: She is alert. She has normal reflexes. No cranial nerve deficit. She exhibits normal muscle tone. Coordination normal.  Skin: Skin is warm and dry. No rash noted. She is not diaphoretic. No erythema. No pallor.  Psychiatric: She has a normal mood and affect. Her behavior is normal.  Judgment and thought  content normal.          Assessment & Plan:  Well exam. We discussed diet and exercise. Get fasting labs today  Laurey Morale, MD

## 2016-10-06 LAB — CYTOLOGY - PAP: DIAGNOSIS: NEGATIVE

## 2016-10-25 ENCOUNTER — Ambulatory Visit
Admission: RE | Admit: 2016-10-25 | Discharge: 2016-10-25 | Disposition: A | Discharge status: Home / Self Care | Payer: 59 | Source: Ambulatory Visit | Attending: Family Medicine | Admitting: Family Medicine

## 2016-10-25 DIAGNOSIS — Z1231 Encounter for screening mammogram for malignant neoplasm of breast: Secondary | ICD-10-CM

## 2017-01-10 MED FILL — AMLODIPINE BESYLATE 5 MG TA: 5 | 90 days supply | Qty: 90 | Fill #0

## 2017-01-10 MED FILL — ATORVASTATIN 40 MG TABLET: 40 | 90 days supply | Qty: 90 | Fill #0

## 2017-02-15 IMAGING — CR DG CHEST 2V
2 series · 2 of 2 positions shown · non-contrast
Comparison: 04/02/2007

CLINICAL DATA: Routine checkup, no complains

EXAM:
CHEST  2 VIEW

[view not recorded (1 of 2)]
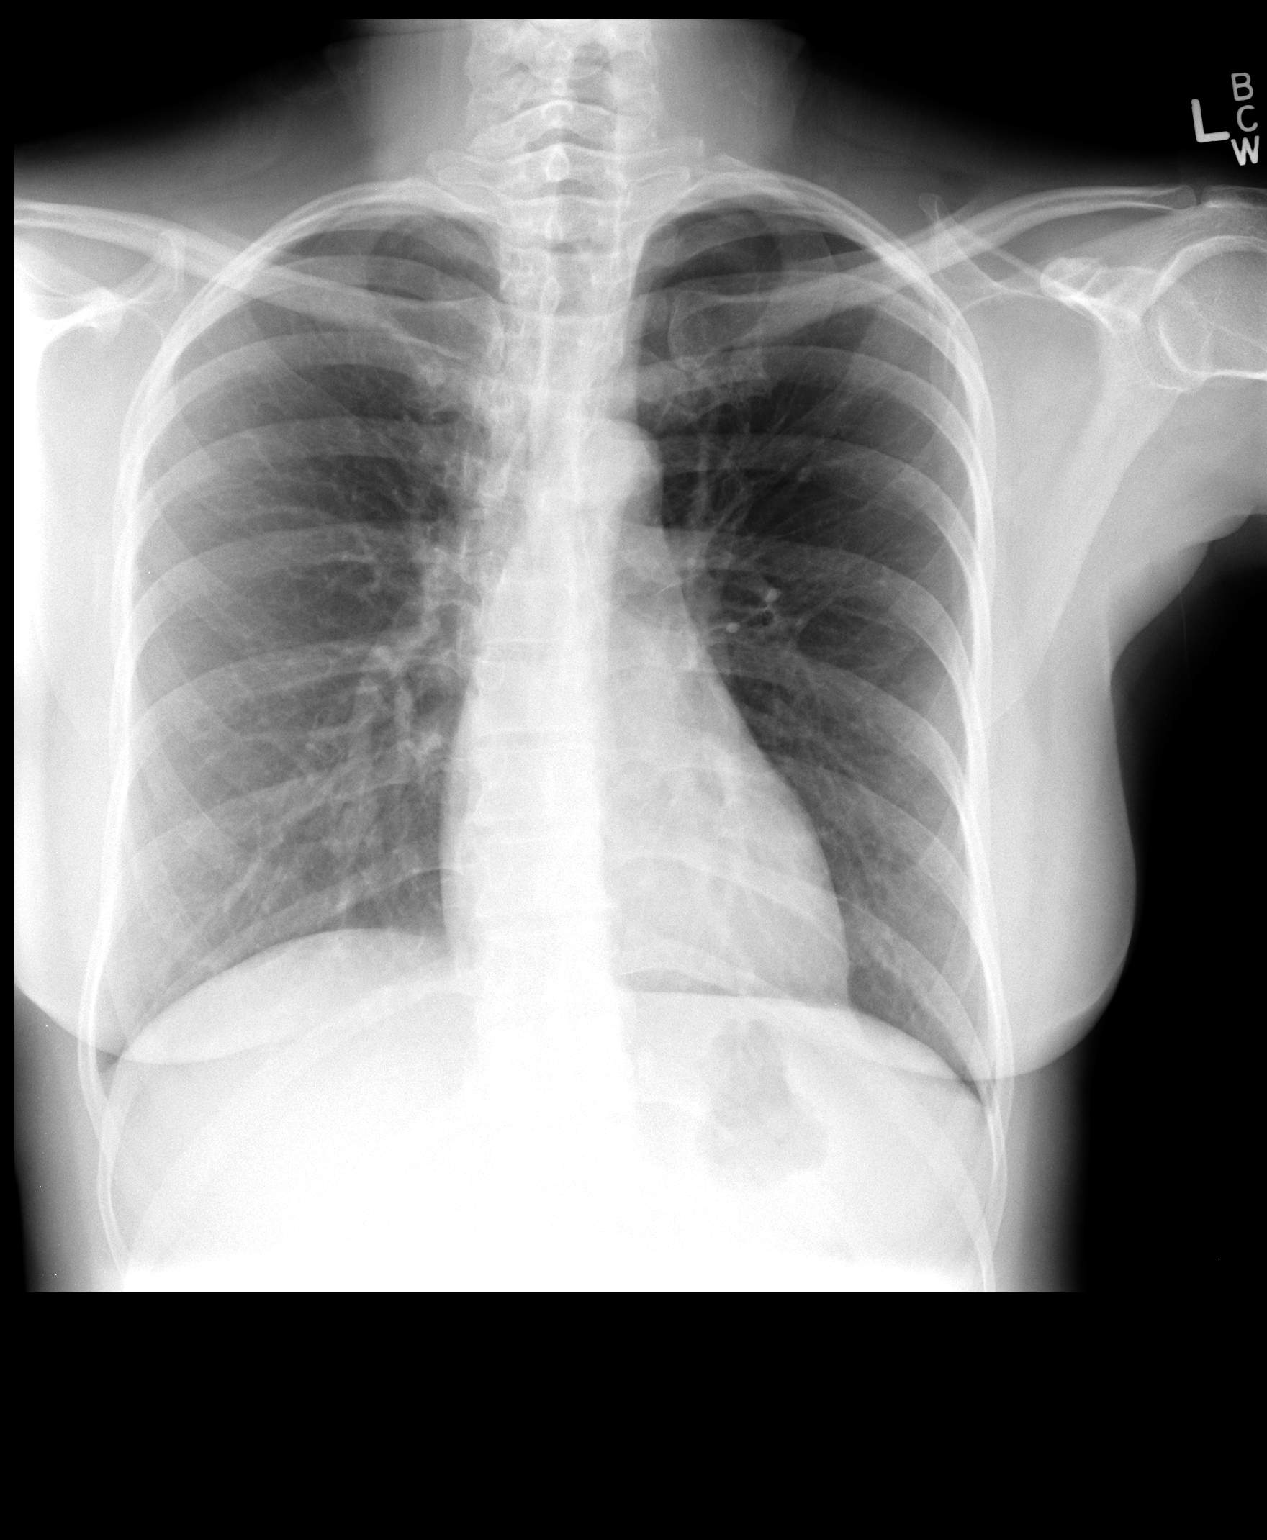

[view not recorded (2 of 2)]
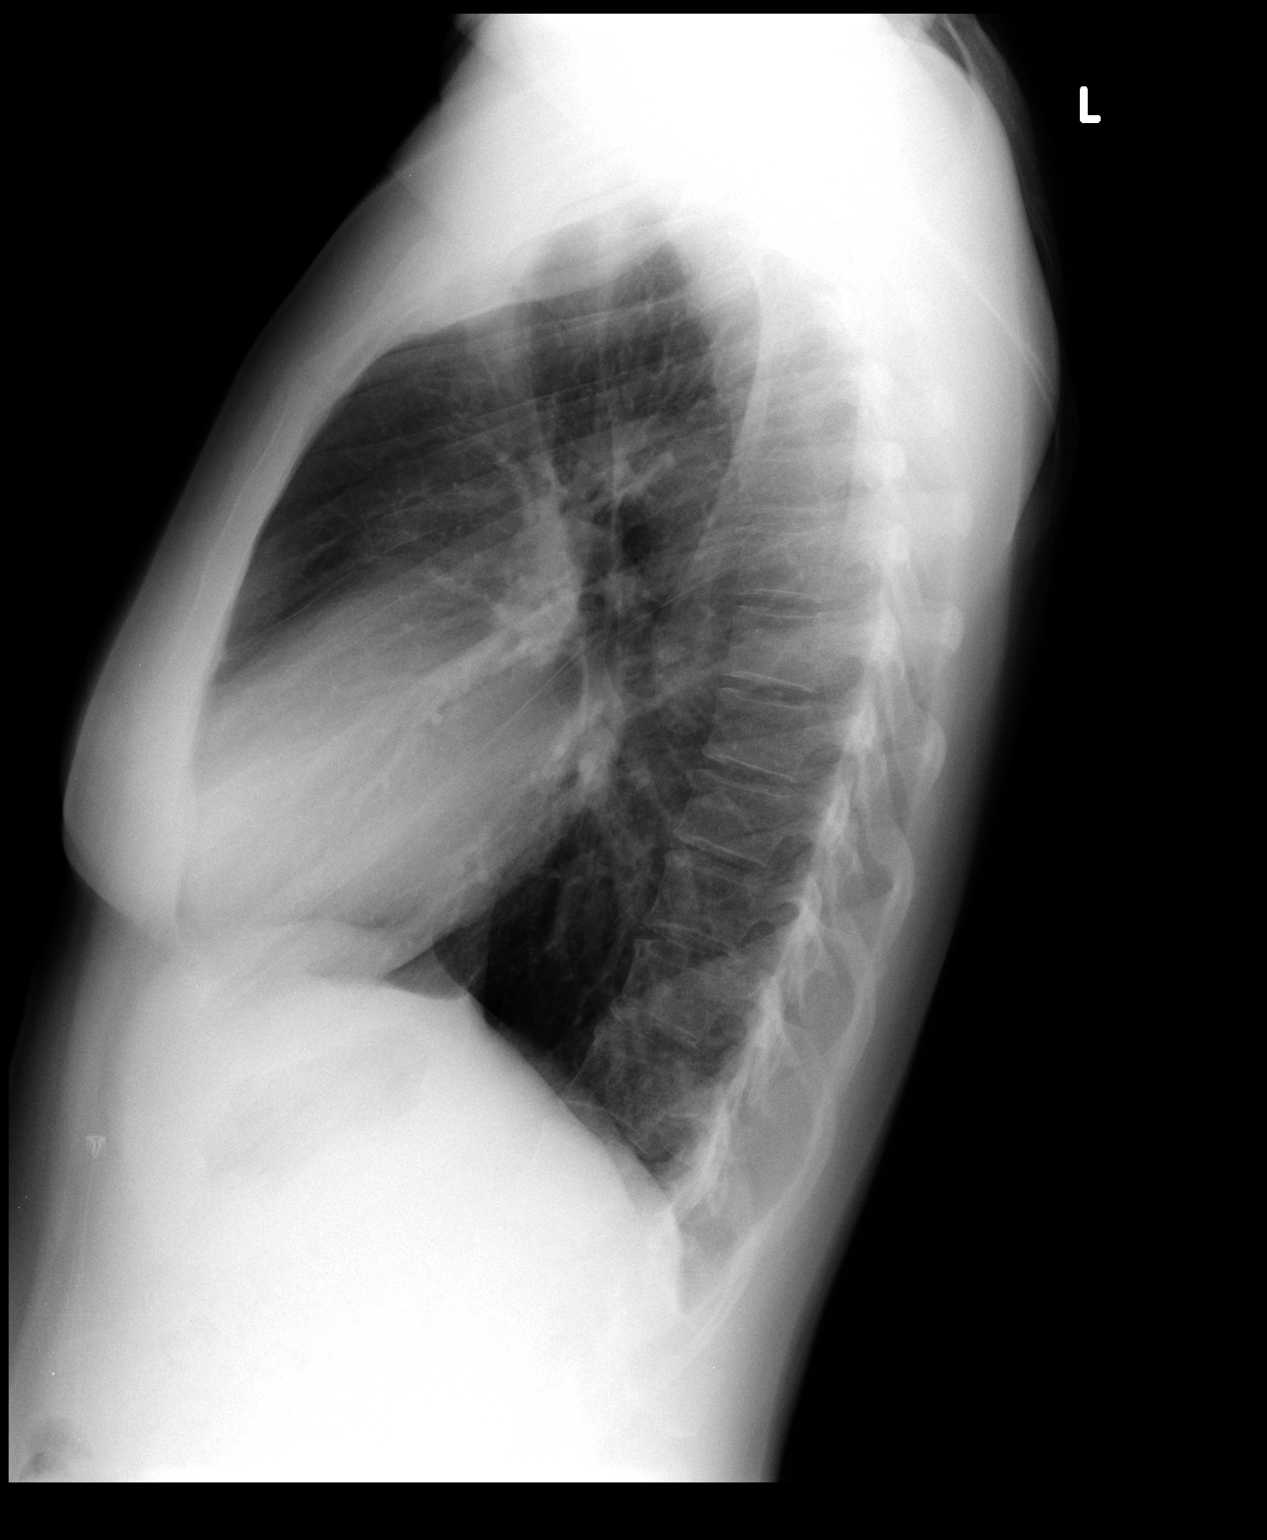

[2 of 2 positions shown; findings below may reference images not displayed]

FINDINGS: Cardiomediastinal silhouette is stable. No acute infiltrate or
pleural effusion. No pulmonary edema. Minimal dextroscoliosis.
IMPRESSION: No active disease.  Minimal dextroscoliosis.

## 2017-07-10 DIAGNOSIS — H52223 Regular astigmatism, bilateral: Secondary | ICD-10-CM | POA: Diagnosis not present

## 2017-07-10 DIAGNOSIS — H524 Presbyopia: Secondary | ICD-10-CM | POA: Diagnosis not present

## 2017-07-10 DIAGNOSIS — H5203 Hypermetropia, bilateral: Secondary | ICD-10-CM | POA: Diagnosis not present

## 2017-07-11 MED FILL — ATORVASTATIN 40 MG TABLET: 40 | 90 days supply | Qty: 90 | Fill #1

## 2017-07-11 MED FILL — AMLODIPINE BESYLATE 5 MG TA: 5 | 90 days supply | Qty: 90 | Fill #1

## 2017-08-22 ENCOUNTER — Ambulatory Visit (INDEPENDENT_AMBULATORY_CARE_PROVIDER_SITE_OTHER): Payer: 59

## 2017-08-22 DIAGNOSIS — Z23 Encounter for immunization: Secondary | ICD-10-CM

## 2017-09-18 ENCOUNTER — Other Ambulatory Visit: Payer: Self-pay | Admitting: Family Medicine

## 2017-09-18 DIAGNOSIS — Z1231 Encounter for screening mammogram for malignant neoplasm of breast: Secondary | ICD-10-CM

## 2017-10-09 ENCOUNTER — Encounter: Payer: Self-pay | Admitting: Family Medicine

## 2017-10-09 ENCOUNTER — Other Ambulatory Visit (HOSPITAL_COMMUNITY)
Admission: RE | Admit: 2017-10-09 | Discharge: 2017-10-09 | Disposition: A | Discharge status: Home / Self Care | Payer: 59 | Source: Ambulatory Visit | Attending: Family Medicine | Admitting: Family Medicine

## 2017-10-09 ENCOUNTER — Ambulatory Visit (INDEPENDENT_AMBULATORY_CARE_PROVIDER_SITE_OTHER): Payer: 59 | Admitting: Family Medicine

## 2017-10-09 VITALS — BP 122/86 | Temp 98.5°F | Ht 65.0 in | Wt 141.0 lb

## 2017-10-09 DIAGNOSIS — Z0000 Encounter for general adult medical examination without abnormal findings: Secondary | ICD-10-CM | POA: Insufficient documentation

## 2017-10-09 DIAGNOSIS — R1030 Lower abdominal pain, unspecified: Secondary | ICD-10-CM | POA: Diagnosis not present

## 2017-10-09 LAB — CBC WITH DIFFERENTIAL/PLATELET
BASOS ABS: 0 10*3/uL (ref 0.0–0.1)
Basophils Relative: 0.5 % (ref 0.0–3.0)
Eosinophils Absolute: 0 10*3/uL (ref 0.0–0.7)
Eosinophils Relative: 0.7 % (ref 0.0–5.0)
HEMATOCRIT: 42.9 % (ref 36.0–46.0)
Hemoglobin: 13.7 g/dL (ref 12.0–15.0)
LYMPHS PCT: 52.7 % — AB (ref 12.0–46.0)
Lymphs Abs: 2.1 10*3/uL (ref 0.7–4.0)
MCHC: 32 g/dL (ref 30.0–36.0)
MCV: 82.7 fl (ref 78.0–100.0)
MONOS PCT: 5.8 % (ref 3.0–12.0)
Monocytes Absolute: 0.2 10*3/uL (ref 0.1–1.0)
Neutro Abs: 1.6 10*3/uL (ref 1.4–7.7)
Neutrophils Relative %: 40.3 % — ABNORMAL LOW (ref 43.0–77.0)
Platelets: 253 10*3/uL (ref 150.0–400.0)
RBC: 5.18 Mil/uL — AB (ref 3.87–5.11)
RDW: 13.6 % (ref 11.5–15.5)
WBC: 4 10*3/uL (ref 4.0–10.5)

## 2017-10-09 LAB — HEPATIC FUNCTION PANEL
ALT: 19 U/L (ref 0–35)
AST: 15 U/L (ref 0–37)
Albumin: 4.5 g/dL (ref 3.5–5.2)
Alkaline Phosphatase: 42 U/L (ref 39–117)
BILIRUBIN DIRECT: 0.1 mg/dL (ref 0.0–0.3)
BILIRUBIN TOTAL: 0.4 mg/dL (ref 0.2–1.2)
Total Protein: 7.1 g/dL (ref 6.0–8.3)

## 2017-10-09 LAB — POC URINALSYSI DIPSTICK (AUTOMATED)
BILIRUBIN UA: NEGATIVE
Blood, UA: NEGATIVE
Glucose, UA: NEGATIVE
Ketones, UA: NEGATIVE
LEUKOCYTES UA: NEGATIVE
NITRITE UA: NEGATIVE
PH UA: 6 (ref 5.0–8.0)
PROTEIN UA: NEGATIVE
Spec Grav, UA: >=1.03 — AB (ref 1.010–1.025)
Urobilinogen, UA: 0.2 E.U./dL

## 2017-10-09 LAB — BASIC METABOLIC PANEL
BUN: 9 mg/dL (ref 6–23)
CO2: 27 mEq/L (ref 19–32)
Calcium: 9.6 mg/dL (ref 8.4–10.5)
Chloride: 105 mEq/L (ref 96–112)
Creatinine, Ser: 0.73 mg/dL (ref 0.40–1.20)
GFR: 111.35 mL/min (ref >60.00)
Glucose, Bld: 89 mg/dL (ref 70–99)
POTASSIUM: 4 meq/L (ref 3.5–5.1)
Sodium: 140 mEq/L (ref 135–145)

## 2017-10-09 LAB — LIPID PANEL
CHOLESTEROL: 184 mg/dL (ref 0–200)
HDL: 63.6 mg/dL (ref >39.00)
LDL Cholesterol: 111 mg/dL — ABNORMAL HIGH (ref 0–99)
NonHDL: 120.66
Total CHOL/HDL Ratio: 3
Triglycerides: 50 mg/dL (ref 0.0–149.0)
VLDL: 10 mg/dL (ref 0.0–40.0)

## 2017-10-09 LAB — TSH: TSH: 1.41 u[IU]/mL (ref 0.35–4.50)

## 2017-10-09 MED ORDER — ATORVASTATIN CALCIUM 40 MG PO TABS: 40.0000 mg | ORAL_TABLET | Freq: Every day | ORAL | 3 refills | Status: DC

## 2017-10-09 MED ORDER — AMLODIPINE BESYLATE 5 MG PO TABS: 5.0000 mg | ORAL_TABLET | Freq: Every day | ORAL | 3 refills | Status: DC

## 2017-10-09 NOTE — Patient Instructions (Signed)
WE NOW OFFER   [facility] FAST TRACK!!!  SAME DAY Appointments for ACUTE CARE  Such as: Sprains, Injuries, cuts, abrasions, rashes, muscle pain, joint pain, back pain Colds, flu, sore throats, headache, allergies, cough, fever  Ear pain, sinus and eye infections Abdominal pain, nausea, vomiting, diarrhea, upset stomach Animal/insect bites  3 Easy Ways to Schedule: Walk-In Scheduling Call in scheduling Mychart Sign-up: [email or web address]         

## 2017-10-09 NOTE — Progress Notes (Signed)
Subjective:    Patient ID: Alyssa Khan, female    DOB: Aug 01, 1971, 46 y.o.   MRN: [id number]  HPI Here for a well exam. She has done well but for the past few months she has had some mild sharp pains in both sides of the lower abdomen about half way between her menses. No fever or DC. Her cycles are regular every month.    Review of Systems  Constitutional: Negative.  Negative for activity change, appetite change, diaphoresis, fatigue, fever and unexpected weight change.  HENT: Negative.  Negative for congestion, ear pain, hearing loss, nosebleeds, sore throat, tinnitus, trouble swallowing and voice change.   Eyes: Negative.  Negative for photophobia, pain, discharge, redness and visual disturbance.  Respiratory: Negative.  Negative for apnea, cough, choking, chest tightness, shortness of breath, wheezing and stridor.   Cardiovascular: Negative.  Negative for chest pain, palpitations and leg swelling.  Gastrointestinal: Positive for abdominal pain. Negative for abdominal distention, blood in stool, constipation, diarrhea, nausea, rectal pain and vomiting.  Genitourinary: Negative.  Negative for difficulty urinating, dysuria, enuresis, flank pain, frequency, hematuria, menstrual problem, urgency, vaginal bleeding, vaginal discharge and vaginal pain.  Musculoskeletal: Negative.  Negative for arthralgias, back pain, gait problem, joint swelling, myalgias, neck pain and neck stiffness.  Skin: Negative.  Negative for color change, pallor, rash and wound.  Neurological: Negative.  Negative for dizziness, tremors, seizures, syncope, speech difficulty, weakness, light-headedness, numbness and headaches.  Hematological: Negative for adenopathy. Does not bruise/bleed easily.  Psychiatric/Behavioral: Negative.  Negative for agitation, behavioral problems, confusion, dysphoric mood, hallucinations and sleep disturbance. The patient is not nervous/anxious.        Objective:   Physical Exam    Constitutional: She appears well-developed and well-nourished. No distress.  HENT:  Head: Normocephalic and atraumatic.  Right Ear: External ear normal.  Left Ear: External ear normal.  Nose: Nose normal.  Mouth/Throat: Oropharynx is clear and moist. No oropharyngeal exudate.  Eyes: Conjunctivae and EOM are normal. Pupils are equal, round, and reactive to light. Right eye exhibits no discharge. Left eye exhibits no discharge. No scleral icterus.  Neck: Normal range of motion. Neck supple. No JVD present. No thyromegaly present.  Cardiovascular: Normal rate, regular rhythm, normal heart sounds and intact distal pulses. Exam reveals no gallop and no friction rub.  No murmur heard. Pulmonary/Chest: Effort normal and breath sounds normal. No stridor. No respiratory distress. She has no wheezes. She has no rales. She exhibits no tenderness.  Abdominal: Soft. Normal appearance and bowel sounds are normal. She exhibits no distension, no abdominal bruit, no ascites and no mass. There is no hepatosplenomegaly. There is no tenderness. There is no rigidity, no rebound and no guarding. No hernia.  Genitourinary: Rectum normal, vagina normal and uterus normal. No breast swelling, tenderness, discharge or bleeding. Cervix exhibits no motion tenderness, no discharge and no friability. Right adnexum displays no mass, no tenderness and no fullness. Left adnexum displays no mass, no tenderness and no fullness. No erythema, tenderness or bleeding in the vagina. No vaginal discharge found.  Musculoskeletal: Normal range of motion. She exhibits no edema or tenderness.  Lymphadenopathy:    She has no cervical adenopathy.  Neurological: She is alert. She has normal reflexes. No cranial nerve deficit. She exhibits normal muscle tone. Coordination normal.  Skin: Skin is warm and dry. No rash noted. She is not diaphoretic. No erythema. No pallor.  Psychiatric: She has a normal mood and affect. Her behavior is normal.  Judgment and  thought content normal.          Assessment & Plan:  Well exam. We discussed diet and exercise. Get fasting labs. It sounds like she is having ovulation pains. We will set up a pelvic US soon to evaluate.  [name], MD

## 2017-10-11 LAB — CYTOLOGY - PAP: DIAGNOSIS: NEGATIVE

## 2017-10-27 ENCOUNTER — Ambulatory Visit
Admission: RE | Admit: 2017-10-27 | Discharge: 2017-10-27 | Disposition: A | Discharge status: Home / Self Care | Payer: 59 | Source: Ambulatory Visit | Attending: Family Medicine | Admitting: Family Medicine

## 2017-10-27 DIAGNOSIS — Z1231 Encounter for screening mammogram for malignant neoplasm of breast: Secondary | ICD-10-CM | POA: Diagnosis not present

## 2018-02-26 MED FILL — AMLODIPINE BESYLATE 5 MG TA: 5 | 90 days supply | Qty: 90 | Fill #0

## 2018-02-26 MED FILL — ATORVASTATIN 40 MG TABLET: 40 | 90 days supply | Qty: 90 | Fill #0

## 2018-07-17 DIAGNOSIS — H524 Presbyopia: Secondary | ICD-10-CM | POA: Diagnosis not present

## 2018-07-17 DIAGNOSIS — H52223 Regular astigmatism, bilateral: Secondary | ICD-10-CM | POA: Diagnosis not present

## 2018-07-17 DIAGNOSIS — H5203 Hypermetropia, bilateral: Secondary | ICD-10-CM | POA: Diagnosis not present

## 2018-08-21 ENCOUNTER — Ambulatory Visit (INDEPENDENT_AMBULATORY_CARE_PROVIDER_SITE_OTHER): Payer: 59 | Admitting: *Deleted

## 2018-08-21 DIAGNOSIS — Z23 Encounter for immunization: Secondary | ICD-10-CM

## 2018-08-21 NOTE — Progress Notes (Signed)
Per orders of Dr. Sarajane Jews, injection of influenza given by Westley Hummer. Patient tolerated injection well.

## 2018-09-12 MED FILL — AMLODIPINE BESYLATE 5 MG TA: 5 | 90 days supply | Qty: 90 | Fill #1

## 2018-09-12 MED FILL — ATORVASTATIN 40 MG TABLET: 40 | 90 days supply | Qty: 90 | Fill #1

## 2018-09-24 ENCOUNTER — Other Ambulatory Visit: Payer: Self-pay | Admitting: Family Medicine

## 2018-09-24 DIAGNOSIS — Z1231 Encounter for screening mammogram for malignant neoplasm of breast: Secondary | ICD-10-CM

## 2018-10-10 ENCOUNTER — Ambulatory Visit (INDEPENDENT_AMBULATORY_CARE_PROVIDER_SITE_OTHER): Payer: 59 | Admitting: Family Medicine

## 2018-10-10 ENCOUNTER — Encounter: Payer: Self-pay | Admitting: Family Medicine

## 2018-10-10 ENCOUNTER — Other Ambulatory Visit (HOSPITAL_COMMUNITY)
Admission: RE | Admit: 2018-10-10 | Discharge: 2018-10-10 | Disposition: A | Discharge status: Home / Self Care | Payer: 59 | Source: Ambulatory Visit | Attending: Family Medicine | Admitting: Family Medicine

## 2018-10-10 VITALS — BP 118/62 | HR 68 | Temp 98.5°F | Ht 65.0 in | Wt 152.1 lb

## 2018-10-10 DIAGNOSIS — Z0000 Encounter for general adult medical examination without abnormal findings: Secondary | ICD-10-CM

## 2018-10-10 LAB — CBC WITH DIFFERENTIAL/PLATELET
BASOS PCT: 0.3 % (ref 0.0–3.0)
Basophils Absolute: 0 10*3/uL (ref 0.0–0.1)
Eosinophils Absolute: 0 10*3/uL (ref 0.0–0.7)
Eosinophils Relative: 0.7 % (ref 0.0–5.0)
HCT: 35.4 % — ABNORMAL LOW (ref 36.0–46.0)
HEMOGLOBIN: 11.6 g/dL — AB (ref 12.0–15.0)
Lymphocytes Relative: 48.1 % — ABNORMAL HIGH (ref 12.0–46.0)
Lymphs Abs: 2.5 10*3/uL (ref 0.7–4.0)
MCHC: 32.7 g/dL (ref 30.0–36.0)
MCV: 71.1 fl — ABNORMAL LOW (ref 78.0–100.0)
Monocytes Absolute: 0.4 10*3/uL (ref 0.1–1.0)
Monocytes Relative: 7.5 % (ref 3.0–12.0)
Neutro Abs: 2.2 10*3/uL (ref 1.4–7.7)
Neutrophils Relative %: 43.4 % (ref 43.0–77.0)
Platelets: 276 10*3/uL (ref 150.0–400.0)
RBC: 4.97 Mil/uL (ref 3.87–5.11)
RDW: 18.5 % — AB (ref 11.5–15.5)
WBC: 5.1 10*3/uL (ref 4.0–10.5)

## 2018-10-10 LAB — POC URINALSYSI DIPSTICK (AUTOMATED)
Bilirubin, UA: NEGATIVE
Blood, UA: NEGATIVE
Glucose, UA: NEGATIVE
Ketones, UA: NEGATIVE
Leukocytes, UA: NEGATIVE
Nitrite, UA: NEGATIVE
PH UA: 7 (ref 5.0–8.0)
PROTEIN UA: NEGATIVE
SPEC GRAV UA: 1.015 (ref 1.010–1.025)
UROBILINOGEN UA: 0.2 U/dL

## 2018-10-10 LAB — BASIC METABOLIC PANEL
BUN: 10 mg/dL (ref 6–23)
CO2: 28 meq/L (ref 19–32)
Calcium: 9.2 mg/dL (ref 8.4–10.5)
Chloride: 106 mEq/L (ref 96–112)
Creatinine, Ser: 0.66 mg/dL (ref 0.40–1.20)
GFR: 124.51 mL/min (ref >60.00)
GLUCOSE: 97 mg/dL (ref 70–99)
POTASSIUM: 4.3 meq/L (ref 3.5–5.1)
SODIUM: 140 meq/L (ref 135–145)

## 2018-10-10 LAB — LIPID PANEL
CHOLESTEROL: 171 mg/dL (ref 0–200)
HDL: 60 mg/dL (ref >39.00)
LDL Cholesterol: 95 mg/dL (ref 0–99)
NonHDL: 111.02
Total CHOL/HDL Ratio: 3
Triglycerides: 82 mg/dL (ref 0.0–149.0)
VLDL: 16.4 mg/dL (ref 0.0–40.0)

## 2018-10-10 LAB — HEPATIC FUNCTION PANEL
ALBUMIN: 4.6 g/dL (ref 3.5–5.2)
ALK PHOS: 47 U/L (ref 39–117)
ALT: 16 U/L (ref 0–35)
AST: 16 U/L (ref 0–37)
Bilirubin, Direct: 0 mg/dL (ref 0.0–0.3)
TOTAL PROTEIN: 7.2 g/dL (ref 6.0–8.3)
Total Bilirubin: 0.4 mg/dL (ref 0.2–1.2)

## 2018-10-10 LAB — TSH: TSH: 2.52 u[IU]/mL (ref 0.35–4.50)

## 2018-10-10 MED ORDER — AMLODIPINE BESYLATE 5 MG PO TABS: 5.0000 mg | ORAL_TABLET | Freq: Every day | ORAL | 3 refills | Status: DC

## 2018-10-10 MED ORDER — ATORVASTATIN CALCIUM 40 MG PO TABS: 40.0000 mg | ORAL_TABLET | Freq: Every day | ORAL | 3 refills | Status: DC

## 2018-10-10 NOTE — Progress Notes (Signed)
Subjective:    Patient ID: Alyssa Khan, female    DOB: June 02, 1971, 47 y.o.   MRN: [id number]  HPI Here for a well exam. She feels fine and her BP is stable.     Review of Systems  Constitutional: Negative.  Negative for activity change, appetite change, diaphoresis, fatigue, fever and unexpected weight change.  HENT: Negative.  Negative for congestion, ear pain, hearing loss, nosebleeds, sore throat, tinnitus, trouble swallowing and voice change.   Eyes: Negative.  Negative for photophobia, pain, discharge, redness and visual disturbance.  Respiratory: Negative.  Negative for apnea, cough, choking, chest tightness, shortness of breath, wheezing and stridor.   Cardiovascular: Negative.  Negative for chest pain, palpitations and leg swelling.  Gastrointestinal: Negative.  Negative for abdominal distention, abdominal pain, blood in stool, constipation, diarrhea, nausea, rectal pain and vomiting.  Genitourinary: Negative.  Negative for decreased urine volume, difficulty urinating, dyspareunia, dysuria, enuresis, flank pain, frequency, hematuria, menstrual problem, pelvic pain, urgency, vaginal bleeding, vaginal discharge and vaginal pain.  Musculoskeletal: Negative.  Negative for arthralgias, back pain, gait problem, joint swelling, myalgias, neck pain and neck stiffness.  Skin: Negative.  Negative for color change, pallor, rash and wound.  Neurological: Negative.  Negative for dizziness, tremors, seizures, syncope, speech difficulty, weakness, light-headedness, numbness and headaches.  Hematological: Negative for adenopathy. Does not bruise/bleed easily.  Psychiatric/Behavioral: Negative.  Negative for agitation, behavioral problems, confusion, dysphoric mood, hallucinations and sleep disturbance. The patient is not nervous/anxious.        Objective:   Physical Exam  Constitutional: She is oriented to person, place, and time. She appears well-developed and well-nourished. No  distress.  HENT:  Head: Normocephalic and atraumatic.  Right Ear: External ear normal.  Left Ear: External ear normal.  Nose: Nose normal.  Mouth/Throat: Oropharynx is clear and moist. No oropharyngeal exudate.  Eyes: Pupils are equal, round, and reactive to light. Conjunctivae and EOM are normal. Right eye exhibits no discharge. Left eye exhibits no discharge. No scleral icterus.  Neck: Normal range of motion. Neck supple. No JVD present. No thyromegaly present.  Cardiovascular: Normal rate, regular rhythm, normal heart sounds and intact distal pulses. Exam reveals no gallop and no friction rub.  No murmur heard. Pulmonary/Chest: Effort normal and breath sounds normal. No stridor. No respiratory distress. She has no wheezes. She has no rales. She exhibits no tenderness. No breast tenderness, discharge or bleeding.  Abdominal: Soft. Normal appearance and bowel sounds are normal. She exhibits no distension, no abdominal bruit, no ascites and no mass. There is no hepatosplenomegaly. There is no tenderness. There is no rigidity, no rebound and no guarding. No hernia.  Genitourinary: Rectum normal, vagina normal and uterus normal. Cervix exhibits no motion tenderness, no discharge and no friability. Right adnexum displays no mass, no tenderness and no fullness. Left adnexum displays no mass, no tenderness and no fullness. No erythema, tenderness or bleeding in the vagina. No vaginal discharge found.  Musculoskeletal: Normal range of motion. She exhibits no edema or tenderness.  Lymphadenopathy:    She has no cervical adenopathy.  Neurological: She is alert and oriented to person, place, and time. She has normal reflexes. She displays normal reflexes. No cranial nerve deficit. She exhibits normal muscle tone. Coordination normal.  Skin: Skin is warm and dry. No rash noted. She is not diaphoretic. No erythema. No pallor.  Psychiatric: She has a normal mood and affect. Her behavior is normal. Judgment  and thought content normal.  Assessment & Plan:  Well exam. We discussed diet and exercise. Get fasting labs.  [name], MD

## 2018-10-11 LAB — CYTOLOGY - PAP: Diagnosis: NEGATIVE

## 2018-10-16 ENCOUNTER — Encounter: Payer: Self-pay | Admitting: Emergency Medicine

## 2018-10-16 ENCOUNTER — Emergency Department (HOSPITAL_COMMUNITY)
Admission: EM | Admit: 2018-10-16 | Discharge: 2018-10-16 | Disposition: A | Discharge status: Home / Self Care | Payer: Worker's Compensation | Attending: Emergency Medicine | Admitting: Emergency Medicine

## 2018-10-16 DIAGNOSIS — Z79899 Other long term (current) drug therapy: Secondary | ICD-10-CM | POA: Insufficient documentation

## 2018-10-16 DIAGNOSIS — M5442 Lumbago with sciatica, left side: Secondary | ICD-10-CM | POA: Diagnosis not present

## 2018-10-16 DIAGNOSIS — I10 Essential (primary) hypertension: Secondary | ICD-10-CM | POA: Insufficient documentation

## 2018-10-16 DIAGNOSIS — M545 Low back pain: Secondary | ICD-10-CM | POA: Diagnosis present

## 2018-10-16 DIAGNOSIS — E785 Hyperlipidemia, unspecified: Secondary | ICD-10-CM | POA: Insufficient documentation

## 2018-10-16 MED ORDER — METHOCARBAMOL 500 MG PO TABS: 500.0000 mg | ORAL_TABLET | Freq: Two times a day (BID) | ORAL | 0 refills | Status: DC

## 2018-10-16 MED ORDER — METHOCARBAMOL 500 MG PO TABS
500.0000 mg | ORAL_TABLET | Freq: Once | ORAL | Status: AC
  Administered 2018-10-16: 500 mg via ORAL
  Filled 2018-10-16: qty 1

## 2018-10-16 MED ORDER — LIDOCAINE 5 % EX PTCH
1.0000 | MEDICATED_PATCH | CUTANEOUS | Status: DC
  Administered 2018-10-16: 1 via TRANSDERMAL
  Filled 2018-10-16: qty 1

## 2018-10-16 MED ORDER — NAPROXEN 500 MG PO TABS: 500.0000 mg | ORAL_TABLET | Freq: Two times a day (BID) | ORAL | 0 refills | Status: DC

## 2018-10-16 MED ORDER — NAPROXEN 500 MG PO TABS
500.0000 mg | ORAL_TABLET | Freq: Once | ORAL | Status: AC
  Administered 2018-10-16: 500 mg via ORAL
  Filled 2018-10-16: qty 1

## 2018-10-16 NOTE — ED Notes (Signed)
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  Reports pain in her lower back radiates down to her L leg.  Pt is ambulatory with a limp.  No tingling or numbness.

## 2018-10-16 NOTE — Discharge Instructions (Signed)
You were seen here today for Back Pain: Low back pain is discomfort in the lower back that may be due to injuries to muscles and ligaments around the spine. Occasionally, it may be caused by a problem to a part of the spine called a disc. Your back pain should be treated with medicines listed below as well as back exercises and this back pain should get better over the next 2 weeks. Most patients get completely well in 4 weeks. It is important to know however, if you develop severe or worsening pain, low back pain with fever, numbness, weakness or inability to walk or urinate, you should return to the ER immediately.  Please follow up with your doctor this week for a recheck if still having symptoms.  HOME INSTRUCTIONS Self - care:  The application of heat can help soothe the pain.  Maintaining your daily activities, including walking (this is encouraged), as it will help you get better faster than just staying in bed. Do not life, push, pull anything more than 10 pounds for the next week. I am attaching back exercises that you can do at home to help facilitate your recovery.   Back Exercises - I have attached a handout on back exercises that can be done at home to help facilitate your recovery.   Medications are also useful to help with pain control.   Lidocaine patches.  Can use over-the-counter salon pas to cane patches, these can be found at the pharmacy in a blue and silver box.  Acetaminophen.  This medication is generally safe, and found over the counter. Take as directed for your age. You should not take more than 8 of the extra strength (500mg ) pills a day (max dose is 4000mg  total OVER one day)  Non steroidal anti inflammatory: This includes medications including Ibuprofen, naproxen and Mobic; These medications help both pain and swelling and are very useful in treating back pain.  They should be taken with food, as they can cause stomach upset, and more seriously, stomach bleeding. Do not  combine the medications.   Muscle relaxants:  These medications can help with muscle tightness that is a cause of lower back pain.  Most of these medications can cause drowsiness, and it is not safe to drive or use dangerous machinery while taking them. They are primarily helpful when taken at night before sleep.  You will need to follow up with your primary healthcare provider in 1-2 weeks for reassessment and persistent symptoms.  Be aware that if you develop new symptoms, such as a fever, leg weakness, difficulty with or loss of control of your urine or bowels, abdominal pain, or more severe pain, you will need to seek medical attention and/or return to the Emergency department. Additional Information:  Your vital signs today were: BP 135/79    Pulse 81    Temp 98.4 F (36.9 C) (Oral)    Resp 18    SpO2 100%  If your blood pressure (BP) was elevated above 135/85 this visit, please have this repeated by your doctor within one month. ---------------

## 2018-10-16 NOTE — ED Provider Notes (Signed)
[facility] DEPT Provider Note   CSN: [id number] Arrival date & time: 10/16/18  1845     History   Chief Complaint Chief Complaint  Patient presents with  . Back Pain    HPI Alyssa Khan is a 47 y.o. female.  Alyssa Khan is a 47 y.o. Female with history of hypertension and hyperlipidemia, who presents to the emergency department for evaluation of low back and left leg pain.  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  She reports since then she has had a constant soreness over the back, and pain that radiates into her left leg with certain movements.  She denies any associated numbness or weakness in the legs, no loss of bowel or bladder control or saddle anesthesia.  No associated fevers or chills, no history of cancer or IV drug use.  She denies any abdominal pain, nausea or vomiting, no dysuria or urinary frequency.  She has not tried anything to treat her symptoms prior to arrival, denies any other aggravating or alleviating factors.     Past Medical History:  Diagnosis Date  . Anemia   . Hyperlipidemia   . Left breast lump    benign fibroadenoma    Patient Active Problem List   Diagnosis Date Noted  . HTN (hypertension) 12/22/2014  . LUMP OR MASS IN BREAST 09/28/2009  . HYPERLIPIDEMIA 08/09/2007  . CHEST PAIN 08/09/2007  . POSITIVE PPD 08/09/2007    Past Surgical History:  Procedure Laterality Date  . APPENDECTOMY    . removal of left breast lump  03/30/10   per Dr. Fanny Skates     OB History   None      Home Medications    Prior to Admission medications   Medication Sig Start Date End Date Taking? Authorizing Provider  amLODipine (NORVASC) 5 MG tablet Take 1 tablet (5 mg total) by mouth daily. 10/10/18   Laurey Morale, MD  atorvastatin (LIPITOR) 40 MG tablet Take 1 tablet (40 mg total) by mouth daily. 10/10/18   Laurey Morale, MD  FERROUS SULFATE PO Take by mouth daily. Over the counter    [provider]  methocarbamol (ROBAXIN) 500 MG tablet Take 1 tablet (500 mg total) by mouth 2 (two) times daily. 10/16/18   Jacqlyn Larsen, PA-C  naproxen (NAPROSYN) 500 MG tablet Take 1 tablet (500 mg total) by mouth 2 (two) times daily. 10/16/18   Jacqlyn Larsen, PA-C    Family History Family History  Problem Relation Age of Onset  . Cancer Other        breast     Social History Social History   Tobacco Use  . Smoking status: Never Smoker  . Smokeless tobacco: Never Used  Substance Use Topics  . Alcohol use: No    Alcohol/week: 0.0 standard drinks  . Drug use: No     Allergies   Lisinopril   Review of Systems Review of Systems  Constitutional: Negative for chills and fever.  HENT: Negative.   Respiratory: Negative for shortness of breath.   Cardiovascular: Negative for chest pain.  Gastrointestinal: Negative for abdominal pain, constipation, diarrhea, nausea and vomiting.  Genitourinary: Negative for dysuria, flank pain, frequency and hematuria.  Musculoskeletal: Positive for back pain. Negative for arthralgias, gait problem, joint swelling, myalgias and neck pain.  Skin: Negative for color change, rash and wound.  Neurological:  Negative for weakness and numbness.     Physical Exam Updated Vital Signs BP 135/79   Pulse 81   Temp 98.4 F (36.9 C) (Oral)   Resp 18   SpO2 100%   Physical Exam  Constitutional: She is oriented to person, place, and time. She appears well-developed and well-nourished. No distress.  HENT:  Head: Atraumatic.  Eyes: Right eye exhibits no discharge. Left eye exhibits no discharge.  Neck: Neck supple.  Cardiovascular:  Pulses:      Radial pulses are 2+ on the right side, and 2+ on the left side.       Dorsalis pedis pulses are 2+ on the right side, and 2+ on the left side.       Posterior tibial pulses are 2+ on the right side, and 2+ on the left  side.  Pulmonary/Chest: Effort normal. No respiratory distress.  Abdominal: Soft. Bowel sounds are normal. She exhibits no distension and no mass. There is no tenderness. There is no guarding.  Abdomen soft, nondistended, nontender to palpation in all quadrants without guarding or peritoneal signs, no CVA tenderness bilaterally  Musculoskeletal:  Tenderness to palpation over entire low back musculature, worse on left than right.  Pain made worse with range of motion of the lower extremities, positive straight leg raise on the left.  Neurological: She is alert and oriented to person, place, and time.  Alert, clear speech, following commands. Moving all extremities without difficulty. Bilateral lower extremities with 5/5 strength in proximal and distal muscle groups and with dorsi and plantar flexion. Sensation intact in bilateral lower extremities. 2+ patellar DTRs bilaterally. Ambulatory with steady gait  Skin: Skin is warm and dry. Capillary refill takes less than 2 seconds. She is not diaphoretic.  Psychiatric: She has a normal mood and affect. Her behavior is normal.  Nursing note and vitals reviewed.    ED Treatments / Results  Labs (all labs ordered are listed, but only abnormal results are displayed) Labs Reviewed - No data to display  EKG None  Radiology No results found.  Procedures Procedures (including critical care time)  Medications Ordered in ED Medications  naproxen (NAPROSYN) tablet 500 mg (has no administration in time range)  methocarbamol (ROBAXIN) tablet 500 mg (has no administration in time range)  lidocaine (LIDODERM) 5 % 1 patch (has no administration in time range)     Initial Impression / Assessment and Plan / ED Course  I have reviewed the triage vital signs and the nursing notes.  Pertinent labs & imaging results that were available during my care of the patient were reviewed by me and considered in my medical decision making (see chart for  details).  Patient with back pain.  No neurological deficits and normal neuro exam.  Patient is ambulatory in the department without difficulty.  No loss of bowel or bladder control.  No concern for cauda equina.  No fever, night sweats, weight loss, h/o cancer, IVDU.  RICE protocol and pain medicine indicated and discussed with patient.   Final Clinical Impressions(s) / ED Diagnoses   Final diagnoses:  Acute bilateral low back pain with left-sided sciatica    ED Discharge Orders         Ordered    naproxen (NAPROSYN) 500 MG tablet  2 times daily     10/16/18 2105    methocarbamol (ROBAXIN) 500 MG tablet  2 times daily     10/16/18 2105           Marijean Bravo,  Audery Amel, PA-C 10/16/18 2111    Daleen Bo, MD 10/19/18 [phone]

## 2018-10-16 NOTE — ED Triage Notes (Signed)
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 Pt works at [employer].

## 2018-10-17 MED FILL — METHOCARBAMOL 500 MG TABS: 500 | 10 days supply | Qty: 20 | Fill #0

## 2018-10-17 MED FILL — NAPROXEN 500 MG TABLET: 500 | 15 days supply | Qty: 30 | Fill #0

## 2018-10-19 ENCOUNTER — Telehealth: Payer: Self-pay | Admitting: *Deleted

## 2018-10-19 DIAGNOSIS — N6320 Unspecified lump in the left breast, unspecified quadrant: Secondary | ICD-10-CM

## 2018-10-19 NOTE — Telephone Encounter (Signed)
Dr. Sarajane Jews please advise on the change of the mammo in December to diagnostic  Thanks

## 2018-10-19 NOTE — Telephone Encounter (Signed)
Copied from [place] [id number]. Topic: General - Other >> Oct 19, 2018  9:01 AM Carolyn Stare wrote:  Pt has a mamo scheduled for Dec and said she has a small lump and they told her to have her PCP change the order to diagnostic

## 2018-10-20 NOTE — Telephone Encounter (Signed)
Where is the lump?

## 2018-10-22 NOTE — Telephone Encounter (Signed)
vcalled and spoke with the pt and she stated that the lump is on The left breast on the left side.  She felt this while doing a self exam and was concerned as she has had to have a lumpectomy in the past.  Dr. Sarajane Jews please advise. Thanks

## 2018-10-23 NOTE — Addendum Note (Signed)
Addended by: [name] A on: 10/23/2018 11:30 AM   Modules accepted: Orders

## 2018-10-23 NOTE — Telephone Encounter (Signed)
I ordered a diagnostic mammogram and a left breast US at the [facility]

## 2018-10-30 ENCOUNTER — Emergency Department (HOSPITAL_COMMUNITY)
Admission: EM | Admit: 2018-10-30 | Discharge: 2018-10-30 | Disposition: A | Discharge status: Home / Self Care | Payer: Worker's Compensation | Attending: Emergency Medicine | Admitting: Emergency Medicine

## 2018-10-30 ENCOUNTER — Encounter (HOSPITAL_COMMUNITY): Payer: Self-pay | Admitting: Emergency Medicine

## 2018-10-30 DIAGNOSIS — Y92003 Bedroom of unspecified non-institutional (private) residence as the place of occurrence of the external cause: Secondary | ICD-10-CM | POA: Insufficient documentation

## 2018-10-30 DIAGNOSIS — Y990 Civilian activity done for income or pay: Secondary | ICD-10-CM | POA: Insufficient documentation

## 2018-10-30 DIAGNOSIS — I10 Essential (primary) hypertension: Secondary | ICD-10-CM | POA: Insufficient documentation

## 2018-10-30 DIAGNOSIS — W502XXA Accidental twist by another person, initial encounter: Secondary | ICD-10-CM | POA: Insufficient documentation

## 2018-10-30 DIAGNOSIS — M62830 Muscle spasm of back: Secondary | ICD-10-CM | POA: Diagnosis not present

## 2018-10-30 DIAGNOSIS — S39012A Strain of muscle, fascia and tendon of lower back, initial encounter: Secondary | ICD-10-CM | POA: Diagnosis not present

## 2018-10-30 DIAGNOSIS — Y93F2 Activity, caregiving, lifting: Secondary | ICD-10-CM | POA: Insufficient documentation

## 2018-10-30 DIAGNOSIS — Z79899 Other long term (current) drug therapy: Secondary | ICD-10-CM | POA: Diagnosis not present

## 2018-10-30 DIAGNOSIS — S3992XA Unspecified injury of lower back, initial encounter: Secondary | ICD-10-CM | POA: Diagnosis present

## 2018-10-30 MED ORDER — OXYCODONE-ACETAMINOPHEN 5-325 MG PO TABS
1.0000 | ORAL_TABLET | Freq: Once | ORAL | Status: AC
  Administered 2018-10-30: 1 via ORAL
  Filled 2018-10-30: qty 1

## 2018-10-30 MED ORDER — NAPROXEN 500 MG PO TABS: 500.0000 mg | ORAL_TABLET | Freq: Two times a day (BID) | ORAL | 0 refills | Status: DC

## 2018-10-30 MED ORDER — CYCLOBENZAPRINE HCL 10 MG PO TABS
10.0000 mg | ORAL_TABLET | Freq: Once | ORAL | Status: AC
  Administered 2018-10-30: 10 mg via ORAL
  Filled 2018-10-30: qty 1

## 2018-10-30 MED ORDER — PREDNISONE 10 MG PO TABS: 40.0000 mg | ORAL_TABLET | Freq: Every day | ORAL | 0 refills | Status: DC

## 2018-10-30 MED ORDER — KETOROLAC TROMETHAMINE 60 MG/2ML IM SOLN
30.0000 mg | Freq: Once | INTRAMUSCULAR | Status: AC
  Administered 2018-10-30: 30 mg via INTRAMUSCULAR
  Filled 2018-10-30: qty 2

## 2018-10-30 MED ORDER — CYCLOBENZAPRINE HCL 10 MG PO TABS: 10.0000 mg | ORAL_TABLET | Freq: Two times a day (BID) | ORAL | 0 refills | Status: DC | PRN

## 2018-10-30 NOTE — ED Triage Notes (Signed)
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 Pt reports the resident fell back on her. Patient c/o mid lower back pains.

## 2018-10-30 NOTE — Discharge Instructions (Signed)
Do not drive or work while taking the muscle relaxer as it will make you sleepy.

## 2018-10-30 NOTE — ED Provider Notes (Signed)
[facility] DEPT Provider Note   CSN: [id number] Arrival date & time: 10/30/18  1845     History   Chief Complaint Chief Complaint  Patient presents with  . 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 Patient reports she was helping a resident get out of bed when the resident fell back causing the patient's back to pull. The pain radiates to both legs. Patient reports she can not stand the pain and she has been waiting several hours without any pain medication. I told her I would order medication and then come back to examine her.   HPI  Past Medical History:  Diagnosis Date  . Anemia   . Hyperlipidemia   . Left breast lump    benign fibroadenoma    Patient Active Problem List   Diagnosis Date Noted  . HTN (hypertension) 12/22/2014  . LUMP OR MASS IN BREAST 09/28/2009  . HYPERLIPIDEMIA 08/09/2007  . CHEST PAIN 08/09/2007  . POSITIVE PPD 08/09/2007    Past Surgical History:  Procedure Laterality Date  . APPENDECTOMY    . removal of left breast lump  03/30/10   per Dr. Fanny Skates     OB History   None      Home Medications    Prior to Admission medications   Medication Sig Start Date End Date Taking? Authorizing Provider  amLODipine (NORVASC) 5 MG tablet Take 1 tablet (5 mg total) by mouth daily. 10/10/18   Laurey Morale, MD  atorvastatin (LIPITOR) 40 MG tablet Take 1 tablet (40 mg total) by mouth daily. 10/10/18   Laurey Morale, MD  cyclobenzaprine (FLEXERIL) 10 MG tablet Take 1 tablet (10 mg total) by mouth 2 (two) times daily as needed for muscle spasms. 10/30/18   Ashley Murrain, NP  FERROUS SULFATE PO Take by mouth daily. Over the counter    [provider]  naproxen (NAPROSYN) 500 MG tablet Take 1 tablet (500 mg total) by mouth 2 (two) times daily. 10/30/18   Ashley Murrain, NP  predniSONE (DELTASONE) 10 MG tablet Take 4 tablets (40 mg  total) by mouth daily with breakfast. 10/30/18   Ashley Murrain, NP    Family History Family History  Problem Relation Age of Onset  . Cancer Other        breast     Social History Social History   Tobacco Use  . Smoking status: Never Smoker  . Smokeless tobacco: Never Used  Substance Use Topics  . Alcohol use: No    Alcohol/week: 0.0 standard drinks  . Drug use: No     Allergies   Lisinopril   Review of Systems Review of Systems  Gastrointestinal: Negative for nausea and vomiting.  Musculoskeletal: Positive for arthralgias and back pain.     Physical Exam Updated Vital Signs BP 118/73 (BP Location: Right Arm)   Pulse 66   Temp 98.5 F (36.9 C) (Oral)   Resp 16   SpO2 100%  9:45 pm after patient's medication is working she will let me examine her.  Physical Exam  Constitutional: She appears well-developed and well-nourished.  Eyes: EOM are normal.  Neck: Neck supple.  Pulmonary/Chest: Effort normal.  Abdominal: Soft. There is no tenderness.  Musculoskeletal:       Lumbar back: She exhibits tenderness and spasm. She exhibits no deformity and normal pulse. Decreased range  of motion: due to pain.  Neurological: She is alert. She has normal strength. No sensory deficit.  Reflex Scores:      Bicep reflexes are 2+ on the right side and 2+ on the left side.      Brachioradialis reflexes are 2+ on the right side and 2+ on the left side.      Patellar reflexes are 2+ on the right side and 2+ on the left side. Ambulatory without foot drag.  Skin: Skin is warm and dry.  Psychiatric: She has a normal mood and affect. Her behavior is normal.  Nursing note and vitals reviewed.    ED Treatments / Results  Labs (all labs ordered are listed, but only abnormal results are displayed) Labs Reviewed - No data to display  Radiology No results found.  Procedures Procedures (including critical care time)  Medications Ordered in ED Medications  ketorolac (TORADOL)  injection 30 mg (30 mg Intramuscular Given 10/30/18 2122)  cyclobenzaprine (FLEXERIL) tablet 10 mg (10 mg Oral Given 10/30/18 2123)  oxyCODONE-acetaminophen (PERCOCET/ROXICET) 5-325 MG per tablet 1 tablet (1 tablet Oral Given 10/30/18 2217)     Initial Impression / Assessment and Plan / ED Course  I have reviewed the triage vital signs and the nursing notes. Patient with back pain.  No neurological deficits and normal neuro exam.  Patient can walk but states is painful.  No loss of bowel or bladder control.  No concern for cauda equina.  No fever, night sweats, weight loss, h/o cancer, IVDU.  RICE protocol and pain medicine indicated and discussed with patient.  Final Clinical Impressions(s) / ED Diagnoses   Final diagnoses:  Muscle spasm of back  Lumbosacral strain, initial encounter    ED Discharge Orders         Ordered    cyclobenzaprine (FLEXERIL) 10 MG tablet  2 times daily PRN     10/30/18 2216    naproxen (NAPROSYN) 500 MG tablet  2 times daily     10/30/18 2216    predniSONE (DELTASONE) 10 MG tablet  Daily with breakfast     10/30/18 2216           Debroah Baller Opdyke, NP 10/30/18 2218    Drenda Freeze, MD 10/30/18 2328

## 2018-10-31 ENCOUNTER — Ambulatory Visit
Admission: RE | Admit: 2018-10-31 | Discharge: 2018-10-31 | Disposition: A | Discharge status: Home / Self Care | Payer: 59 | Source: Ambulatory Visit | Attending: Family Medicine | Admitting: Family Medicine

## 2018-10-31 ENCOUNTER — Other Ambulatory Visit: Payer: Self-pay | Admitting: Family Medicine

## 2018-10-31 DIAGNOSIS — N6321 Unspecified lump in the left breast, upper outer quadrant: Secondary | ICD-10-CM | POA: Diagnosis not present

## 2018-10-31 DIAGNOSIS — N6320 Unspecified lump in the left breast, unspecified quadrant: Secondary | ICD-10-CM

## 2018-10-31 DIAGNOSIS — N6310 Unspecified lump in the right breast, unspecified quadrant: Secondary | ICD-10-CM

## 2018-10-31 DIAGNOSIS — R922 Inconclusive mammogram: Secondary | ICD-10-CM | POA: Diagnosis not present

## 2018-10-31 DIAGNOSIS — N6001 Solitary cyst of right breast: Secondary | ICD-10-CM | POA: Diagnosis not present

## 2018-10-31 MED FILL — NAPROXEN 500 MG TABLET: 500 | 10 days supply | Qty: 20 | Fill #0

## 2018-10-31 MED FILL — CYCLOBENZAPRINE 10 MG TAB: 10 | 10 days supply | Qty: 20 | Fill #0

## 2018-10-31 MED FILL — predniSONE 10 MG TABS: 10 | 4 days supply | Qty: 16 | Fill #0

## 2018-11-01 ENCOUNTER — Ambulatory Visit: Payer: 59

## 2018-11-08 ENCOUNTER — Other Ambulatory Visit: Payer: Self-pay | Admitting: Family Medicine

## 2018-11-08 ENCOUNTER — Ambulatory Visit
Admission: RE | Admit: 2018-11-08 | Discharge: 2018-11-08 | Disposition: A | Discharge status: Home / Self Care | Payer: 59 | Source: Ambulatory Visit | Attending: Family Medicine | Admitting: Family Medicine

## 2018-11-08 DIAGNOSIS — D242 Benign neoplasm of left breast: Secondary | ICD-10-CM | POA: Diagnosis not present

## 2018-11-08 DIAGNOSIS — N6320 Unspecified lump in the left breast, unspecified quadrant: Secondary | ICD-10-CM

## 2018-11-08 DIAGNOSIS — N6321 Unspecified lump in the left breast, upper outer quadrant: Secondary | ICD-10-CM | POA: Diagnosis not present

## 2018-11-09 HISTORY — PX: BREAST BIOPSY: SHX20

## 2019-04-02 MED FILL — ATORVASTATIN 40 MG TABLET: 40 | 90 days supply | Qty: 90 | Fill #0

## 2019-04-02 MED FILL — AMLODIPINE BESYLATE 5 MG TA: 5 | 90 days supply | Qty: 90 | Fill #0

## 2019-04-10 ENCOUNTER — Telehealth: Payer: Self-pay | Admitting: Family Medicine

## 2019-04-10 DIAGNOSIS — D242 Benign neoplasm of left breast: Secondary | ICD-10-CM

## 2019-04-10 NOTE — Telephone Encounter (Signed)
Dr. Fry please advise. Thanks  

## 2019-04-10 NOTE — Telephone Encounter (Signed)
Copied from [place] [id number]. Topic: Quick Communication - See Telephone Encounter >> Apr 10, 2019  1:42 PM Sheran Luz wrote: CRM for notification. See Telephone encounter for: 04/10/19.  Patient called regarding possible referral for lump in breast to be removed. She states that this has been discussed previously with PCP and is requesting call back from CMA to discuss possible options. Please advise.

## 2019-04-10 NOTE — Telephone Encounter (Signed)
I referred her to Surgery

## 2019-05-07 NOTE — Telephone Encounter (Signed)
Alyssa Khan please advise if this can be refaxed to [facility].  The pt stated that they never received the referral.  thanks

## 2019-05-07 NOTE — Telephone Encounter (Signed)
Pt states that [facility] states they did not get referral. Please refax this referral! Back again so pt can fu with this . #[id number] with questions pt contact is [phone]

## 2019-05-20 NOTE — Telephone Encounter (Signed)
I uploaded the notes to [facility] the referral is placed on [facility] made notes on the referral

## 2019-07-27 ENCOUNTER — Ambulatory Visit (INDEPENDENT_AMBULATORY_CARE_PROVIDER_SITE_OTHER): Payer: 59

## 2019-07-27 ENCOUNTER — Other Ambulatory Visit: Payer: Self-pay

## 2019-07-27 DIAGNOSIS — Z23 Encounter for immunization: Secondary | ICD-10-CM

## 2019-08-12 DIAGNOSIS — H5203 Hypermetropia, bilateral: Secondary | ICD-10-CM | POA: Diagnosis not present

## 2019-08-12 DIAGNOSIS — H524 Presbyopia: Secondary | ICD-10-CM | POA: Diagnosis not present

## 2019-08-12 DIAGNOSIS — H52223 Regular astigmatism, bilateral: Secondary | ICD-10-CM | POA: Diagnosis not present

## 2019-10-09 ENCOUNTER — Other Ambulatory Visit: Payer: Self-pay | Admitting: Family Medicine

## 2019-10-09 DIAGNOSIS — R5381 Other malaise: Secondary | ICD-10-CM

## 2019-10-11 ENCOUNTER — Other Ambulatory Visit: Payer: Self-pay | Admitting: Family Medicine

## 2019-10-11 MED FILL — AMLODIPINE BESYLATE 5 MG TA: 5 | 90 days supply | Qty: 90 | Fill #0

## 2019-10-11 MED FILL — ATORVASTATIN 40 MG TABLET: 40 | 90 days supply | Qty: 90 | Fill #0

## 2019-10-14 ENCOUNTER — Other Ambulatory Visit: Payer: Self-pay

## 2019-10-14 ENCOUNTER — Ambulatory Visit (INDEPENDENT_AMBULATORY_CARE_PROVIDER_SITE_OTHER): Payer: 59 | Admitting: Family Medicine

## 2019-10-14 ENCOUNTER — Other Ambulatory Visit (HOSPITAL_COMMUNITY)
Admission: RE | Admit: 2019-10-14 | Discharge: 2019-10-14 | Disposition: A | Discharge status: Home / Self Care | Payer: 59 | Source: Ambulatory Visit | Attending: Family Medicine | Admitting: Family Medicine

## 2019-10-14 ENCOUNTER — Encounter: Payer: Self-pay | Admitting: Family Medicine

## 2019-10-14 VITALS — BP 140/82 | HR 65 | Temp 98.0°F | Ht 65.0 in | Wt 148.0 lb

## 2019-10-14 DIAGNOSIS — Z23 Encounter for immunization: Secondary | ICD-10-CM | POA: Diagnosis not present

## 2019-10-14 DIAGNOSIS — Z124 Encounter for screening for malignant neoplasm of cervix: Secondary | ICD-10-CM

## 2019-10-14 DIAGNOSIS — N6322 Unspecified lump in the left breast, upper inner quadrant: Secondary | ICD-10-CM | POA: Diagnosis not present

## 2019-10-14 DIAGNOSIS — Z0000 Encounter for general adult medical examination without abnormal findings: Secondary | ICD-10-CM

## 2019-10-14 LAB — LIPID PANEL
Cholesterol: 257 mg/dL — ABNORMAL HIGH (ref 0–200)
HDL: 60.6 mg/dL (ref >39.00)
LDL Cholesterol: 175 mg/dL — ABNORMAL HIGH (ref 0–99)
NonHDL: 196.19
Total CHOL/HDL Ratio: 4
Triglycerides: 104 mg/dL (ref 0.0–149.0)
VLDL: 20.8 mg/dL (ref 0.0–40.0)

## 2019-10-14 LAB — HEPATIC FUNCTION PANEL
ALT: 13 U/L (ref 0–35)
AST: 13 U/L (ref 0–37)
Albumin: 4.5 g/dL (ref 3.5–5.2)
Alkaline Phosphatase: 43 U/L (ref 39–117)
Bilirubin, Direct: 0.1 mg/dL (ref 0.0–0.3)
Total Bilirubin: 0.5 mg/dL (ref 0.2–1.2)
Total Protein: 7.2 g/dL (ref 6.0–8.3)

## 2019-10-14 LAB — CBC WITH DIFFERENTIAL/PLATELET
Basophils Absolute: 0.1 10*3/uL (ref 0.0–0.1)
Basophils Relative: 1.2 % (ref 0.0–3.0)
Eosinophils Absolute: 0 10*3/uL (ref 0.0–0.7)
Eosinophils Relative: 0.8 % (ref 0.0–5.0)
HCT: 33.7 % — ABNORMAL LOW (ref 36.0–46.0)
Hemoglobin: 10.5 g/dL — ABNORMAL LOW (ref 12.0–15.0)
Lymphocytes Relative: 50.5 % — ABNORMAL HIGH (ref 12.0–46.0)
Lymphs Abs: 2.6 10*3/uL (ref 0.7–4.0)
MCHC: 31.2 g/dL (ref 30.0–36.0)
MCV: 67.4 fl — ABNORMAL LOW (ref 78.0–100.0)
Monocytes Absolute: 0.3 10*3/uL (ref 0.1–1.0)
Monocytes Relative: 6.8 % (ref 3.0–12.0)
Neutro Abs: 2.1 10*3/uL (ref 1.4–7.7)
Neutrophils Relative %: 40.7 % — ABNORMAL LOW (ref 43.0–77.0)
Platelets: 305 10*3/uL (ref 150.0–400.0)
RBC: 5 Mil/uL (ref 3.87–5.11)
RDW: 18.9 % — ABNORMAL HIGH (ref 11.5–15.5)
WBC: 5.1 10*3/uL (ref 4.0–10.5)

## 2019-10-14 LAB — BASIC METABOLIC PANEL
BUN: 9 mg/dL (ref 6–23)
CO2: 23 mEq/L (ref 19–32)
Calcium: 9.2 mg/dL (ref 8.4–10.5)
Chloride: 106 mEq/L (ref 96–112)
Creatinine, Ser: 0.68 mg/dL (ref 0.40–1.20)
GFR: 112.68 mL/min (ref >60.00)
Glucose, Bld: 89 mg/dL (ref 70–99)
Potassium: 4 mEq/L (ref 3.5–5.1)
Sodium: 138 mEq/L (ref 135–145)

## 2019-10-14 LAB — TSH: TSH: 3.51 u[IU]/mL (ref 0.35–4.50)

## 2019-10-14 MED ORDER — CYCLOBENZAPRINE HCL 10 MG PO TABS: 10.0000 mg | ORAL_TABLET | Freq: Three times a day (TID) | ORAL | 5 refills | Status: DC | PRN

## 2019-10-14 MED ORDER — ATORVASTATIN CALCIUM 40 MG PO TABS: 40.0000 mg | ORAL_TABLET | Freq: Every day | ORAL | 3 refills | Status: DC

## 2019-10-14 MED ORDER — AMLODIPINE BESYLATE 5 MG PO TABS: 5.0000 mg | ORAL_TABLET | Freq: Every day | ORAL | 3 refills | Status: DC

## 2019-10-14 MED ORDER — NAPROXEN 500 MG PO TABS: 500.0000 mg | ORAL_TABLET | Freq: Two times a day (BID) | ORAL | 3 refills | Status: DC

## 2019-10-14 MED FILL — CYCLOBENZAPRINE 10 MG TAB: 10 | 30 days supply | Qty: 90 | Fill #0

## 2019-10-14 MED FILL — NAPROXEN 500 MG TABLET: 500 | 90 days supply | Qty: 180 | Fill #0

## 2019-10-14 NOTE — Progress Notes (Signed)
Subjective:    Patient ID: Alyssa Khan, female    DOB: Jul 15, 1971, 48 y.o.   MRN: [id number]  HPI Here for a well exam. She feels well but she still has the painful lump in the left breast. One year ago we worked this up with a diagnostic mammogram and [place], and a needle biopsy proved this to be a fibroadenoma. This was painful for her and on 04-28-19 we referred her to Surgery to consider removal. However despite her calling them several times, no appt was ever made.    Review of Systems  Constitutional: Negative.  Negative for activity change, appetite change, diaphoresis, fatigue, fever and unexpected weight change.  HENT: Negative.  Negative for congestion, ear pain, hearing loss, nosebleeds, sore throat, tinnitus, trouble swallowing and voice change.   Eyes: Negative.  Negative for photophobia, pain, discharge, redness and visual disturbance.  Respiratory: Negative.  Negative for apnea, cough, choking, chest tightness, shortness of breath, wheezing and stridor.   Cardiovascular: Negative.  Negative for chest pain, palpitations and leg swelling.  Gastrointestinal: Negative.  Negative for abdominal distention, abdominal pain, blood in stool, constipation, diarrhea, nausea, rectal pain and vomiting.  Genitourinary: Negative.  Negative for difficulty urinating, dysuria, enuresis, flank pain, frequency, hematuria, menstrual problem, urgency, vaginal bleeding, vaginal discharge and vaginal pain.  Musculoskeletal: Negative.  Negative for arthralgias, back pain, gait problem, joint swelling, myalgias, neck pain and neck stiffness.  Skin: Negative.  Negative for color change, pallor, rash and wound.  Neurological: Negative.  Negative for dizziness, tremors, seizures, syncope, speech difficulty, weakness, light-headedness, numbness and headaches.  Hematological: Negative for adenopathy. Does not bruise/bleed easily.  Psychiatric/Behavioral: Negative.  Negative for agitation, behavioral  problems, confusion, dysphoric mood, hallucinations and sleep disturbance. The patient is not nervous/anxious.        Objective:   Physical Exam Constitutional:      General: She is not in acute distress.    Appearance: Normal appearance. She is well-developed. She is not diaphoretic.  HENT:     Head: Normocephalic and atraumatic.     Right Ear: External ear normal.     Left Ear: External ear normal.     Nose: Nose normal.     Mouth/Throat:     Pharynx: No oropharyngeal exudate.  Eyes:     General: No scleral icterus.       Right eye: No discharge.        Left eye: No discharge.     Conjunctiva/sclera: Conjunctivae normal.     Pupils: Pupils are equal, round, and reactive to light.  Neck:     Musculoskeletal: Normal range of motion and neck supple.     Thyroid: No thyromegaly.     Vascular: No JVD.  Cardiovascular:     Rate and Rhythm: Normal rate and regular rhythm.     Heart sounds: Normal heart sounds. No murmur. No friction rub. No gallop.   Pulmonary:     Effort: Pulmonary effort is normal. No respiratory distress.     Breath sounds: Normal breath sounds. No stridor. No wheezing or rales.  Chest:     Chest wall: No tenderness.  Abdominal:     General: Bowel sounds are normal. There is no distension or abdominal bruit.     Palpations: Abdomen is soft. Abdomen is not rigid. There is no mass.     Tenderness: There is no abdominal tenderness. There is no guarding or rebound.     Hernia: No hernia is present.  Genitourinary:  Vagina: Normal. No vaginal discharge, erythema, tenderness or bleeding.     Cervix: No cervical motion tenderness, discharge or friability.     Adnexa:        Right: No mass, tenderness or fullness.         Left: No mass, tenderness or fullness.       Rectum: Normal.     Comments: There is a 1 cm round well circumscribed lump which is tender in the left breast at the 1 o'clock postiion, the axilla is clear. The right side is clear   Musculoskeletal: Normal range of motion.        General: No tenderness.  Lymphadenopathy:     Cervical: No cervical adenopathy.  Skin:    General: Skin is warm and dry.     Coloration: Skin is not pale.     Findings: No erythema or rash.  Neurological:     Mental Status: She is alert.     Cranial Nerves: No cranial nerve deficit.     Motor: No abnormal muscle tone.     Coordination: Coordination normal.     Deep Tendon Reflexes: Reflexes are normal and symmetric.  Psychiatric:        Behavior: Behavior normal.        Thought Content: Thought content normal.        Judgment: Judgment normal.           Assessment & Plan:  Well exam. We discussed diet and exercise. Get fasting labs. We will get another diagnostic mammogram and US of the left breast lump. After that she will contact Surgery about an appt.  Alyssa Penna, MD

## 2019-10-14 NOTE — Addendum Note (Signed)
Addended by: Gwenyth Ober R on: 10/14/2019 09:52 AM   Modules accepted: Orders

## 2019-10-15 LAB — CYTOLOGY - PAP: Diagnosis: NEGATIVE

## 2019-10-31 NOTE — Addendum Note (Signed)
Addended by: Gwenyth Ober R on: 10/31/2019 11:08 PM   Modules accepted: Orders

## 2019-11-04 ENCOUNTER — Ambulatory Visit
Admission: RE | Admit: 2019-11-04 | Discharge: 2019-11-04 | Disposition: A | Discharge status: Home / Self Care | Payer: 59 | Source: Ambulatory Visit | Attending: Family Medicine | Admitting: Family Medicine

## 2019-11-04 ENCOUNTER — Other Ambulatory Visit: Payer: Self-pay

## 2019-11-04 DIAGNOSIS — N6322 Unspecified lump in the left breast, upper inner quadrant: Secondary | ICD-10-CM

## 2019-11-04 DIAGNOSIS — N6321 Unspecified lump in the left breast, upper outer quadrant: Secondary | ICD-10-CM | POA: Diagnosis not present

## 2019-11-04 DIAGNOSIS — R922 Inconclusive mammogram: Secondary | ICD-10-CM | POA: Diagnosis not present

## 2019-11-30 DIAGNOSIS — Z23 Encounter for immunization: Secondary | ICD-10-CM | POA: Diagnosis not present

## 2019-12-02 DIAGNOSIS — D242 Benign neoplasm of left breast: Secondary | ICD-10-CM | POA: Diagnosis not present

## 2019-12-02 DIAGNOSIS — N644 Mastodynia: Secondary | ICD-10-CM | POA: Diagnosis not present

## 2019-12-28 DIAGNOSIS — Z23 Encounter for immunization: Secondary | ICD-10-CM | POA: Diagnosis not present

## 2020-02-10 ENCOUNTER — Other Ambulatory Visit: Payer: Self-pay | Admitting: Family Medicine

## 2020-02-10 MED FILL — ATORVASTATIN 40 MG TABLET: 40 | 90 days supply | Qty: 90 | Fill #0

## 2020-02-10 MED FILL — AMLODIPINE BESYLATE 5 MG TA: 5 | 90 days supply | Qty: 90 | Fill #0

## 2020-06-29 MED FILL — CYCLOBENZAPRINE HCL 10 MG T: 10 | 30 days supply | Qty: 90 | Fill #1

## 2020-07-21 MED FILL — ATORVASTATIN 40 MG TABLET: 40 | 90 days supply | Qty: 90 | Fill #1

## 2020-07-21 MED FILL — AMLODIPINE BESYLATE 5 MG TA: 5 | 90 days supply | Qty: 90 | Fill #1

## 2020-08-12 DIAGNOSIS — H53143 Visual discomfort, bilateral: Secondary | ICD-10-CM | POA: Diagnosis not present

## 2020-08-12 DIAGNOSIS — H524 Presbyopia: Secondary | ICD-10-CM | POA: Diagnosis not present

## 2020-08-12 DIAGNOSIS — H52223 Regular astigmatism, bilateral: Secondary | ICD-10-CM | POA: Diagnosis not present

## 2020-08-12 DIAGNOSIS — H5203 Hypermetropia, bilateral: Secondary | ICD-10-CM | POA: Diagnosis not present

## 2020-08-13 MED FILL — AZITHROMYCIN 500 MG TABLET: 500 | 3 days supply | Qty: 4 | Fill #0

## 2020-08-13 MED FILL — ATOVAQUONE-PROGUANIL 250-10: 250-100 | 29 days supply | Qty: 29 | Fill #0

## 2020-10-05 ENCOUNTER — Other Ambulatory Visit: Payer: Self-pay | Admitting: Family Medicine

## 2020-10-05 DIAGNOSIS — Z1231 Encounter for screening mammogram for malignant neoplasm of breast: Secondary | ICD-10-CM

## 2020-10-14 ENCOUNTER — Ambulatory Visit (INDEPENDENT_AMBULATORY_CARE_PROVIDER_SITE_OTHER): Payer: 59 | Admitting: Family Medicine

## 2020-10-14 ENCOUNTER — Other Ambulatory Visit: Payer: Self-pay | Admitting: Family Medicine

## 2020-10-14 ENCOUNTER — Other Ambulatory Visit: Payer: Self-pay

## 2020-10-14 ENCOUNTER — Encounter: Payer: Self-pay | Admitting: Family Medicine

## 2020-10-14 VITALS — BP 118/76 | HR 69 | Temp 98.7°F | Ht 65.0 in | Wt 147.4 lb

## 2020-10-14 DIAGNOSIS — Z0000 Encounter for general adult medical examination without abnormal findings: Secondary | ICD-10-CM

## 2020-10-14 MED ORDER — NAPROXEN 500 MG PO TABS: 500.0000 mg | ORAL_TABLET | Freq: Two times a day (BID) | ORAL | 3 refills | Status: DC

## 2020-10-14 MED ORDER — ATORVASTATIN CALCIUM 40 MG PO TABS: 40.0000 mg | ORAL_TABLET | Freq: Every day | ORAL | 3 refills | Status: DC

## 2020-10-14 MED ORDER — AMLODIPINE BESYLATE 5 MG PO TABS: 5.0000 mg | ORAL_TABLET | Freq: Every day | ORAL | 3 refills | Status: DC

## 2020-10-14 MED ORDER — CYCLOBENZAPRINE HCL 10 MG PO TABS: 10.0000 mg | ORAL_TABLET | Freq: Three times a day (TID) | ORAL | 5 refills | Status: DC | PRN

## 2020-10-14 MED FILL — ATORVASTATIN 40 MG TABLET: 40 | 90 days supply | Qty: 90 | Fill #0

## 2020-10-14 MED FILL — AMLODIPINE BESYLATE 5 MG TA: 5 | 90 days supply | Qty: 90 | Fill #0

## 2020-10-14 MED FILL — CYCLOBENZAPRINE HCL 10 MG T: 10 | 30 days supply | Qty: 90 | Fill #0

## 2020-10-14 MED FILL — NAPROXEN 500 MG TABLET: 500 | 90 days supply | Qty: 180 | Fill #0

## 2020-10-14 NOTE — Progress Notes (Signed)
   Subjective:    Patient ID: Alyssa Khan, female    DOB: April 30, 1971, 49 y.o.   MRN: [id number]  HPI Here for a well exam. She feels good but she does note heavier menses over th epast 6 months. She has bleeding for 7 days, the first 3 days of which are heavier. No excessive cramping. No irregular cycles. Last January she had a biopsy performed on a left breast lump which revealed a benign fibroadenoma. This does not bother her.    Review of Systems  Constitutional: Negative.   HENT: Negative.   Eyes: Negative.   Respiratory: Negative.   Cardiovascular: Negative.   Gastrointestinal: Negative.   Genitourinary: Negative for decreased urine volume, difficulty urinating, dyspareunia, dysuria, enuresis, flank pain, frequency, hematuria, pelvic pain, urgency, vaginal discharge and vaginal pain.  Musculoskeletal: Negative.   Skin: Negative.   Neurological: Negative.   Psychiatric/Behavioral: Negative.        Objective:   Physical Exam Constitutional:      General: She is not in acute distress.    Appearance: She is well-developed.  HENT:     Head: Normocephalic and atraumatic.     Right Ear: External ear normal.     Left Ear: External ear normal.     Nose: Nose normal.     Mouth/Throat:     Pharynx: No oropharyngeal exudate.  Eyes:     General: No scleral icterus.    Conjunctiva/sclera: Conjunctivae normal.     Pupils: Pupils are equal, round, and reactive to light.  Neck:     Thyroid: No thyromegaly.     Vascular: No JVD.  Cardiovascular:     Rate and Rhythm: Normal rate and regular rhythm.     Heart sounds: Normal heart sounds. No murmur heard.  No friction rub. No gallop.   Pulmonary:     Effort: Pulmonary effort is normal. No respiratory distress.     Breath sounds: Normal breath sounds. No wheezing or rales.  Chest:     Chest wall: No tenderness.  Abdominal:     General: Bowel sounds are normal. There is no distension.     Palpations: Abdomen is soft. There  is no mass.     Tenderness: There is no abdominal tenderness. There is no guarding or rebound.  Musculoskeletal:        General: No tenderness. Normal range of motion.     Cervical back: Normal range of motion and neck supple.  Lymphadenopathy:     Cervical: No cervical adenopathy.  Skin:    General: Skin is warm and dry.     Findings: No erythema or rash.  Neurological:     Mental Status: She is alert and oriented to person, place, and time.     Cranial Nerves: No cranial nerve deficit.     Motor: No abnormal muscle tone.     Coordination: Coordination normal.     Deep Tendon Reflexes: Reflexes are normal and symmetric. Reflexes normal.  Psychiatric:        Behavior: Behavior normal.        Thought Content: Thought content normal.        Judgment: Judgment normal.           Assessment & Plan:  Well exam. She is on her menses today so we will do a pelvic at another time. We discussed diet and exercise. She is scheduled for another mammogram on 11-13-20. Get fasting labs.  [name], MD

## 2020-10-14 NOTE — Addendum Note (Signed)
Addended by: Marrion Coy on: 10/14/2020 08:43 AM   Modules accepted: Orders

## 2020-10-15 LAB — BASIC METABOLIC PANEL WITH GFR
BUN: 11 mg/dL (ref 7–25)
CO2: 27 mmol/L (ref 20–32)
Calcium: 9.4 mg/dL (ref 8.6–10.2)
Chloride: 106 mmol/L (ref 98–110)
Creat: 0.71 mg/dL (ref 0.50–1.10)
GFR, Est African American: 118 mL/min/{1.73_m2} (ref >=60)
GFR, Est Non African American: 101 mL/min/{1.73_m2} (ref >=60)
Glucose, Bld: 93 mg/dL (ref 65–99)
Potassium: 4 mmol/L (ref 3.5–5.3)
Sodium: 142 mmol/L (ref 135–146)

## 2020-10-15 LAB — CBC WITH DIFFERENTIAL/PLATELET
Absolute Monocytes: 242 cells/uL (ref 200–950)
Basophils Absolute: 31 cells/uL (ref 0–200)
Basophils Relative: 0.8 %
Eosinophils Absolute: 51 cells/uL (ref 15–500)
Eosinophils Relative: 1.3 %
HCT: 37.8 % (ref 35.0–45.0)
Hemoglobin: 12 g/dL (ref 11.7–15.5)
Lymphs Abs: 1860 cells/uL (ref 850–3900)
MCH: 25.8 pg — ABNORMAL LOW (ref 27.0–33.0)
MCHC: 31.7 g/dL — ABNORMAL LOW (ref 32.0–36.0)
MCV: 81.1 fL (ref 80.0–100.0)
MPV: 11.5 fL (ref 7.5–12.5)
Monocytes Relative: 6.2 %
Neutro Abs: 1716 cells/uL (ref 1500–7800)
Neutrophils Relative %: 44 %
Platelets: 242 10*3/uL (ref 140–400)
RBC: 4.66 10*6/uL (ref 3.80–5.10)
RDW: 16.5 % — ABNORMAL HIGH (ref 11.0–15.0)
Total Lymphocyte: 47.7 %
WBC: 3.9 10*3/uL (ref 3.8–10.8)

## 2020-10-15 LAB — TSH: TSH: 1.91 mIU/L

## 2020-10-15 LAB — HEPATIC FUNCTION PANEL
AG Ratio: 1.8 (calc) (ref 1.0–2.5)
ALT: 24 U/L (ref 6–29)
AST: 17 U/L (ref 10–35)
Albumin: 4.4 g/dL (ref 3.6–5.1)
Alkaline phosphatase (APISO): 44 U/L (ref 31–125)
Bilirubin, Direct: 0.1 mg/dL (ref 0.0–0.2)
Globulin: 2.4 g/dL (calc) (ref 1.9–3.7)
Indirect Bilirubin: 0.4 mg/dL (calc) (ref 0.2–1.2)
Total Bilirubin: 0.5 mg/dL (ref 0.2–1.2)
Total Protein: 6.8 g/dL (ref 6.1–8.1)

## 2020-10-15 LAB — IRON,TIBC AND FERRITIN PANEL
%SAT: 15 % (calc) — ABNORMAL LOW (ref 16–45)
Ferritin: 22 ng/mL (ref 16–232)
Iron: 55 ug/dL (ref 40–190)
TIBC: 372 mcg/dL (calc) (ref 250–450)

## 2020-10-15 LAB — LIPID PANEL
Cholesterol: 172 mg/dL (ref <200)
HDL: 53 mg/dL (ref >=50)
LDL Cholesterol (Calc): 101 mg/dL (calc) — ABNORMAL HIGH
Non-HDL Cholesterol (Calc): 119 mg/dL (calc) (ref <130)
Total CHOL/HDL Ratio: 3.2 (calc) (ref <5.0)
Triglycerides: 85 mg/dL (ref <150)

## 2020-10-28 DEATH — deceased

## 2020-11-13 ENCOUNTER — Ambulatory Visit: Payer: 59

## 2020-11-14 ENCOUNTER — Ambulatory Visit: Payer: 59 | Attending: Internal Medicine

## 2020-11-14 DIAGNOSIS — Z23 Encounter for immunization: Secondary | ICD-10-CM

## 2020-11-14 NOTE — Progress Notes (Signed)
   Covid-19 Vaccination Clinic  Name:  Alyssa Khan    MRN: [id number] DOB: 09-20-1971  11/14/2020  [name] was observed post Covid-19 immunization for 15 minutes without incident. She was provided with Vaccine Information Sheet and instruction to access the V-Safe system.   [name] was instructed to call 911 with any severe reactions post vaccine: Mar[place] Kitchen Difficulty breathing  . Swelling of face and throat  . A fast heartbeat  . A bad rash all over body  . Dizziness and weakness   Immunizations Administered    Name Date Dose VIS Date Route   Moderna Covid-19 Booster Vaccine 11/14/2020 10:15 AM 0.25 mL 09/16/2020 Intramuscular   Manufacturer: Levan Hurst   Lot: [id number]   [place]: [phone]

## 2020-11-15 DIAGNOSIS — Z20822 Contact with and (suspected) exposure to covid-19: Secondary | ICD-10-CM | POA: Diagnosis not present

## 2020-12-28 ENCOUNTER — Ambulatory Visit
Admission: RE | Admit: 2020-12-28 | Discharge: 2020-12-28 | Disposition: A | Discharge status: Home / Self Care | Payer: 59 | Source: Ambulatory Visit | Attending: Family Medicine | Admitting: Family Medicine

## 2020-12-28 ENCOUNTER — Other Ambulatory Visit: Payer: Self-pay

## 2020-12-28 DIAGNOSIS — Z1231 Encounter for screening mammogram for malignant neoplasm of breast: Secondary | ICD-10-CM | POA: Diagnosis not present

## 2021-01-11 MED FILL — AMLODIPINE BESYLATE 5 MG TA: 5 | 90 days supply | Qty: 90 | Fill #1

## 2021-01-25 ENCOUNTER — Ambulatory Visit (INDEPENDENT_AMBULATORY_CARE_PROVIDER_SITE_OTHER): Payer: 59 | Admitting: Family Medicine

## 2021-01-25 ENCOUNTER — Encounter: Payer: Self-pay | Admitting: Family Medicine

## 2021-01-25 ENCOUNTER — Other Ambulatory Visit: Payer: Self-pay | Admitting: Family Medicine

## 2021-01-25 ENCOUNTER — Other Ambulatory Visit: Payer: Self-pay

## 2021-01-25 VITALS — BP 128/70 | HR 70 | Temp 97.9°F | Wt 146.6 lb

## 2021-01-25 DIAGNOSIS — M940 Chondrocostal junction syndrome [Tietze]: Secondary | ICD-10-CM | POA: Diagnosis not present

## 2021-01-25 MED ORDER — METHYLPREDNISOLONE 4 MG PO TBPK: ORAL_TABLET | ORAL | 0 refills | Status: DC

## 2021-01-25 MED FILL — methylPREDNISolone 4 MG dos: 4 | 6 days supply | Qty: 21 | Fill #0

## 2021-01-25 NOTE — Progress Notes (Signed)
   Subjective:    Patient ID: Alyssa Khan, female    DOB: 05-20-1971, 50 y.o.   MRN: [id number]  HPI Here for 4 days of pain in the right upper back, right shoulder, and right chest. No cough or SOB. No recent trauma. Tylenol helps for a short time.    Review of Systems  Constitutional: Negative.   Respiratory: Negative.   Cardiovascular: Positive for chest pain. Negative for palpitations and leg swelling.  Musculoskeletal: Positive for back pain.       Objective:   Physical Exam Constitutional:      General: She is not in acute distress.    Appearance: Normal appearance.  Cardiovascular:     Rate and Rhythm: Normal rate and regular rhythm.     Pulses: Normal pulses.     Heart sounds: Normal heart sounds.  Pulmonary:     Effort: Pulmonary effort is normal.     Breath sounds: Normal breath sounds.  Musculoskeletal:     Comments: Tender along the left sternal border and along the left side of the thoracic spine. The left shoulder is normal  Neurological:     Mental Status: She is alert.           Assessment & Plan:  Costochondritis, treat with a Medrol dose pack.  [name], MD

## 2021-07-10 DIAGNOSIS — H524 Presbyopia: Secondary | ICD-10-CM | POA: Diagnosis not present

## 2021-07-10 DIAGNOSIS — H52223 Regular astigmatism, bilateral: Secondary | ICD-10-CM | POA: Diagnosis not present

## 2021-07-10 DIAGNOSIS — H5203 Hypermetropia, bilateral: Secondary | ICD-10-CM | POA: Diagnosis not present

## 2021-09-06 DIAGNOSIS — Z23 Encounter for immunization: Secondary | ICD-10-CM | POA: Diagnosis not present

## 2021-10-18 ENCOUNTER — Encounter: Payer: 59 | Admitting: Family Medicine

## 2021-10-25 ENCOUNTER — Other Ambulatory Visit (HOSPITAL_COMMUNITY): Payer: Self-pay

## 2021-10-25 ENCOUNTER — Other Ambulatory Visit: Payer: Self-pay | Admitting: Family Medicine

## 2021-10-25 MED ORDER — AMLODIPINE BESYLATE 5 MG PO TABS
5.0000 mg | ORAL_TABLET | Freq: Every day | ORAL | 3 refills | Status: DC
  Filled 2021-10-25: qty 90, 90d supply, fill #0
  Filled 2022-04-21: qty 90, 90d supply, fill #1

## 2021-10-25 MED ORDER — ATORVASTATIN CALCIUM 40 MG PO TABS
40.0000 mg | ORAL_TABLET | Freq: Every day | ORAL | 3 refills | Status: DC
  Filled 2021-10-25: qty 90, 90d supply, fill #0
  Filled 2022-04-21: qty 90, 90d supply, fill #1

## 2021-11-17 ENCOUNTER — Telehealth: Payer: Self-pay | Admitting: *Deleted

## 2021-11-17 ENCOUNTER — Ambulatory Visit (INDEPENDENT_AMBULATORY_CARE_PROVIDER_SITE_OTHER): Payer: 59 | Admitting: Family Medicine

## 2021-11-17 ENCOUNTER — Encounter: Payer: Self-pay | Admitting: Family Medicine

## 2021-11-17 ENCOUNTER — Other Ambulatory Visit (HOSPITAL_COMMUNITY)
Admission: RE | Admit: 2021-11-17 | Discharge: 2021-11-17 | Disposition: A | Discharge status: Home / Self Care | Payer: 59 | Source: Ambulatory Visit | Attending: Family Medicine | Admitting: Family Medicine

## 2021-11-17 ENCOUNTER — Other Ambulatory Visit (HOSPITAL_COMMUNITY): Payer: Self-pay

## 2021-11-17 VITALS — BP 122/80 | HR 87 | Temp 98.6°F | Ht 65.5 in | Wt 142.4 lb

## 2021-11-17 DIAGNOSIS — R1900 Intra-abdominal and pelvic swelling, mass and lump, unspecified site: Secondary | ICD-10-CM

## 2021-11-17 DIAGNOSIS — D509 Iron deficiency anemia, unspecified: Secondary | ICD-10-CM | POA: Diagnosis not present

## 2021-11-17 DIAGNOSIS — Z0000 Encounter for general adult medical examination without abnormal findings: Secondary | ICD-10-CM | POA: Diagnosis not present

## 2021-11-17 DIAGNOSIS — Z124 Encounter for screening for malignant neoplasm of cervix: Secondary | ICD-10-CM | POA: Diagnosis not present

## 2021-11-17 DIAGNOSIS — D259 Leiomyoma of uterus, unspecified: Secondary | ICD-10-CM

## 2021-11-17 LAB — HEPATIC FUNCTION PANEL
ALT: 15 U/L (ref 0–35)
AST: 16 U/L (ref 0–37)
Albumin: 4.5 g/dL (ref 3.5–5.2)
Alkaline Phosphatase: 44 U/L (ref 39–117)
Bilirubin, Direct: 0.1 mg/dL (ref 0.0–0.3)
Total Bilirubin: 0.4 mg/dL (ref 0.2–1.2)
Total Protein: 7.1 g/dL (ref 6.0–8.3)

## 2021-11-17 LAB — IBC + FERRITIN
Ferritin: 2.1 ng/mL — ABNORMAL LOW (ref 10.0–291.0)
Iron: 16 ug/dL — ABNORMAL LOW (ref 42–145)
Saturation Ratios: 3.1 % — ABNORMAL LOW (ref 20.0–50.0)
TIBC: 509.6 ug/dL — ABNORMAL HIGH (ref 250.0–450.0)
Transferrin: 364 mg/dL — ABNORMAL HIGH (ref 212.0–360.0)

## 2021-11-17 LAB — LIPID PANEL
Cholesterol: 142 mg/dL (ref 0–200)
HDL: 56.9 mg/dL (ref >39.00)
LDL Cholesterol: 72 mg/dL (ref 0–99)
NonHDL: 85.54
Total CHOL/HDL Ratio: 3
Triglycerides: 70 mg/dL (ref 0.0–149.0)
VLDL: 14 mg/dL (ref 0.0–40.0)

## 2021-11-17 LAB — BASIC METABOLIC PANEL
BUN: 9 mg/dL (ref 6–23)
CO2: 25 mEq/L (ref 19–32)
Calcium: 9.3 mg/dL (ref 8.4–10.5)
Chloride: 105 mEq/L (ref 96–112)
Creatinine, Ser: 0.58 mg/dL (ref 0.40–1.20)
GFR: 107.2 mL/min (ref >60.00)
Glucose, Bld: 90 mg/dL (ref 70–99)
Potassium: 4.2 mEq/L (ref 3.5–5.1)
Sodium: 138 mEq/L (ref 135–145)

## 2021-11-17 LAB — CBC WITH DIFFERENTIAL/PLATELET
Basophils Absolute: 0 10*3/uL (ref 0.0–0.1)
Basophils Relative: 0.2 % (ref 0.0–3.0)
Eosinophils Absolute: 0 10*3/uL (ref 0.0–0.7)
Eosinophils Relative: 0.8 % (ref 0.0–5.0)
HCT: 27.1 % — ABNORMAL LOW (ref 36.0–46.0)
Hemoglobin: 8 g/dL — CL (ref 12.0–15.0)
Lymphocytes Relative: 42.5 % (ref 12.0–46.0)
Lymphs Abs: 1.9 10*3/uL (ref 0.7–4.0)
MCHC: 29.5 g/dL — ABNORMAL LOW (ref 30.0–36.0)
MCV: 59.6 fl — ABNORMAL LOW (ref 78.0–100.0)
Monocytes Absolute: 0.3 10*3/uL (ref 0.1–1.0)
Monocytes Relative: 6.9 % (ref 3.0–12.0)
Neutro Abs: 2.2 10*3/uL (ref 1.4–7.7)
Neutrophils Relative %: 49.6 % (ref 43.0–77.0)
Platelets: 389 10*3/uL (ref 150.0–400.0)
RBC: 4.54 Mil/uL (ref 3.87–5.11)
RDW: 18.1 % — ABNORMAL HIGH (ref 11.5–15.5)
WBC: 4.4 10*3/uL (ref 4.0–10.5)

## 2021-11-17 LAB — TSH: TSH: 2.92 u[IU]/mL (ref 0.35–5.50)

## 2021-11-17 LAB — HEMOGLOBIN A1C: Hgb A1c MFr Bld: 5.6 % (ref 4.6–6.5)

## 2021-11-17 LAB — IRON: Iron: 16 ug/dL — ABNORMAL LOW (ref 42–145)

## 2021-11-17 MED ORDER — IRON (FERROUS SULFATE) 325 (65 FE) MG PO TABS
1.0000 | ORAL_TABLET | Freq: Two times a day (BID) | ORAL | 5 refills | Status: DC
  Filled 2021-11-17: qty 60, fill #0

## 2021-11-17 NOTE — Telephone Encounter (Signed)
We are addressing this. See my Result Note

## 2021-11-17 NOTE — Addendum Note (Signed)
Addended by: Geradine Girt D on: 11/17/2021 08:54 AM   Modules accepted: Orders

## 2021-11-17 NOTE — Telephone Encounter (Signed)
CRITICAL VALUE STICKER  CRITICAL VALUE:Hemoglobin 8.0, hematocrit 27.1  RECEIVER (on-site recipient of call): Sherryle Lis  DATE & TIME NOTIFIED: 11/17/2021 at 12:02pm  MESSENGER (representative from lab):Karen at the [facility]  MD NOTIFIED: Dr Sarajane Jews  TIME OF NOTIFICATION:12:02pm  RESPONSE:  Urgent message sent via EPIC

## 2021-11-17 NOTE — Addendum Note (Signed)
Addended by: Agnes Lawrence on: 11/17/2021 04:06 PM   Modules accepted: Orders

## 2021-11-17 NOTE — Telephone Encounter (Signed)
Pt is aware of the lab results, Rx for Ferrous Sulfate was called in to pt pharmacy

## 2021-11-17 NOTE — Progress Notes (Signed)
Subjective:    Patient ID: Alyssa Khan, female    DOB: 1971-10-28, 50 y.o.   MRN: [id number]  HPI Here for a well exam. She feels well. Her menses are regular and they last 7 days. The first 3 days have fairly heavy flow. She still takes an iron supplement daily.    Review of Systems  Constitutional: Negative.  Negative for activity change, appetite change, diaphoresis, fatigue, fever and unexpected weight change.  HENT: Negative.  Negative for congestion, ear pain, hearing loss, nosebleeds, sore throat, tinnitus, trouble swallowing and voice change.   Eyes: Negative.  Negative for photophobia, pain, discharge, redness and visual disturbance.  Respiratory: Negative.  Negative for apnea, cough, choking, chest tightness, shortness of breath, wheezing and stridor.   Cardiovascular: Negative.  Negative for chest pain, palpitations and leg swelling.  Gastrointestinal: Negative.  Negative for abdominal distention, abdominal pain, blood in stool, constipation, diarrhea, nausea, rectal pain and vomiting.  Genitourinary: Negative.  Negative for difficulty urinating, dysuria, enuresis, flank pain, frequency, hematuria, menstrual problem, urgency, vaginal bleeding, vaginal discharge and vaginal pain.  Musculoskeletal: Negative.  Negative for arthralgias, back pain, gait problem, joint swelling, myalgias, neck pain and neck stiffness.  Skin: Negative.  Negative for color change, pallor, rash and wound.  Neurological: Negative.  Negative for dizziness, tremors, seizures, syncope, speech difficulty, weakness, light-headedness, numbness and headaches.  Hematological:  Negative for adenopathy. Does not bruise/bleed easily.  Psychiatric/Behavioral: Negative.  Negative for agitation, behavioral problems, confusion, dysphoric mood, hallucinations and sleep disturbance. The patient is not nervous/anxious.       Objective:   Physical Exam Constitutional:      General: She is not in acute distress.     Appearance: Normal appearance. She is well-developed. She is not diaphoretic.  HENT:     Head: Normocephalic and atraumatic.     Right Ear: External ear normal.     Left Ear: External ear normal.     Nose: Nose normal.     Mouth/Throat:     Pharynx: No oropharyngeal exudate.  Eyes:     General: No scleral icterus.       Right eye: No discharge.        Left eye: No discharge.     Conjunctiva/sclera: Conjunctivae normal.     Pupils: Pupils are equal, round, and reactive to light.  Neck:     Thyroid: No thyromegaly.     Vascular: No JVD.  Cardiovascular:     Rate and Rhythm: Normal rate and regular rhythm.     Heart sounds: Normal heart sounds. No murmur heard.   No friction rub. No gallop.  Pulmonary:     Effort: Pulmonary effort is normal. No respiratory distress.     Breath sounds: Normal breath sounds. No stridor. No wheezing or rales.  Chest:     Chest wall: No tenderness.  Abdominal:     General: Bowel sounds are normal. There is no distension or abdominal bruit.     Palpations: Abdomen is soft. Abdomen is not rigid. There is no mass.     Tenderness: There is no abdominal tenderness. There is no guarding or rebound.     Hernia: No hernia is present.  Genitourinary:    General: Normal vulva.     Vagina: Normal. No vaginal discharge, erythema, tenderness or bleeding.     Cervix: No cervical motion tenderness, discharge or friability.     Adnexa:        Right: No mass, tenderness or fullness.  Left: No mass, tenderness or fullness.       Rectum: Normal.     Comments: The superior uterus is firm and mildly enlarged, not tender  Musculoskeletal:        General: No tenderness. Normal range of motion.     Cervical back: Normal range of motion and neck supple.  Lymphadenopathy:     Cervical: No cervical adenopathy.  Skin:    General: Skin is warm and dry.     Coloration: Skin is not pale.     Findings: No erythema or rash.  Neurological:     Mental Status: She is  alert.     Cranial Nerves: No cranial nerve deficit.     Motor: No abnormal muscle tone.     Coordination: Coordination normal.     Deep Tendon Reflexes: Reflexes are normal and symmetric.  Psychiatric:        Behavior: Behavior normal.        Thought Content: Thought content normal.        Judgment: Judgment normal.          Assessment & Plan:  Well exam. We discussed diet and exercise. Get fasting labs. She appears to have a uterine fibroid, which could explain the heavy menses. We will set up a pelvic US to evaluate further.  Alyssa Penna, MD

## 2021-11-18 ENCOUNTER — Other Ambulatory Visit (HOSPITAL_COMMUNITY): Payer: Self-pay

## 2021-11-18 NOTE — Addendum Note (Signed)
Addended by: [name] A on: 11/18/2021 07:52 AM   Modules accepted: Orders

## 2021-11-19 LAB — CYTOLOGY - PAP: Diagnosis: NEGATIVE

## 2021-11-24 ENCOUNTER — Ambulatory Visit
Admission: RE | Admit: 2021-11-24 | Discharge: 2021-11-24 | Disposition: A | Discharge status: Home / Self Care | Payer: 59 | Source: Ambulatory Visit | Attending: Family Medicine | Admitting: Family Medicine

## 2021-11-24 DIAGNOSIS — D252 Subserosal leiomyoma of uterus: Secondary | ICD-10-CM | POA: Diagnosis not present

## 2021-11-24 DIAGNOSIS — R1900 Intra-abdominal and pelvic swelling, mass and lump, unspecified site: Secondary | ICD-10-CM | POA: Diagnosis not present

## 2021-11-24 DIAGNOSIS — N888 Other specified noninflammatory disorders of cervix uteri: Secondary | ICD-10-CM | POA: Diagnosis not present

## 2021-11-25 NOTE — Addendum Note (Signed)
Addended by: [name] A on: 11/25/2021 12:50 PM   Modules accepted: Orders

## 2021-12-01 ENCOUNTER — Other Ambulatory Visit (INDEPENDENT_AMBULATORY_CARE_PROVIDER_SITE_OTHER): Payer: 59

## 2021-12-01 DIAGNOSIS — D509 Iron deficiency anemia, unspecified: Secondary | ICD-10-CM | POA: Diagnosis not present

## 2021-12-01 LAB — CBC WITH DIFFERENTIAL/PLATELET
Basophils Absolute: 0 10*3/uL (ref 0.0–0.1)
Basophils Relative: 0.4 % (ref 0.0–3.0)
Eosinophils Absolute: 0.1 10*3/uL (ref 0.0–0.7)
Eosinophils Relative: 1.3 % (ref 0.0–5.0)
HCT: 32 % — ABNORMAL LOW (ref 36.0–46.0)
Hemoglobin: 9.7 g/dL — ABNORMAL LOW (ref 12.0–15.0)
Lymphocytes Relative: 41.8 % (ref 12.0–46.0)
Lymphs Abs: 1.7 10*3/uL (ref 0.7–4.0)
MCHC: 30.2 g/dL (ref 30.0–36.0)
MCV: 65.3 fl — ABNORMAL LOW (ref 78.0–100.0)
Monocytes Absolute: 0.3 10*3/uL (ref 0.1–1.0)
Monocytes Relative: 7.3 % (ref 3.0–12.0)
Neutro Abs: 2.1 10*3/uL (ref 1.4–7.7)
Neutrophils Relative %: 49.2 % (ref 43.0–77.0)
Platelets: 293 10*3/uL (ref 150.0–400.0)
RBC: 4.9 Mil/uL (ref 3.87–5.11)
RDW: 31.1 % — ABNORMAL HIGH (ref 11.5–15.5)
WBC: 4.2 10*3/uL (ref 4.0–10.5)

## 2021-12-01 LAB — IRON: Iron: 37 ug/dL — ABNORMAL LOW (ref 42–145)

## 2021-12-01 LAB — FOLATE: Folate: 10.3 ng/mL (ref >5.9)

## 2021-12-01 LAB — VITAMIN B12: Vitamin B-12: 894 pg/mL (ref 211–911)

## 2021-12-01 LAB — IBC + FERRITIN
Ferritin: 29.7 ng/mL (ref 10.0–291.0)
Iron: 37 ug/dL — ABNORMAL LOW (ref 42–145)
Saturation Ratios: 8.1 % — ABNORMAL LOW (ref 20.0–50.0)
TIBC: 459.2 ug/dL — ABNORMAL HIGH (ref 250.0–450.0)
Transferrin: 328 mg/dL (ref 212.0–360.0)

## 2021-12-10 ENCOUNTER — Telehealth: Payer: Self-pay | Admitting: Family Medicine

## 2021-12-10 NOTE — Telephone Encounter (Signed)
Pt call and stated she is returning your call and want a call back. 

## 2021-12-13 NOTE — Telephone Encounter (Signed)
No message in chart  as who called patient.

## 2021-12-16 ENCOUNTER — Other Ambulatory Visit: Payer: Self-pay | Admitting: Family Medicine

## 2021-12-16 DIAGNOSIS — Z1231 Encounter for screening mammogram for malignant neoplasm of breast: Secondary | ICD-10-CM

## 2021-12-30 ENCOUNTER — Ambulatory Visit
Admission: RE | Admit: 2021-12-30 | Discharge: 2021-12-30 | Disposition: A | Discharge status: Home / Self Care | Payer: 59 | Source: Ambulatory Visit | Attending: Family Medicine | Admitting: Family Medicine

## 2021-12-30 DIAGNOSIS — Z1231 Encounter for screening mammogram for malignant neoplasm of breast: Secondary | ICD-10-CM | POA: Diagnosis not present

## 2022-01-19 ENCOUNTER — Encounter: Payer: Self-pay | Admitting: Obstetrics

## 2022-01-19 ENCOUNTER — Other Ambulatory Visit (HOSPITAL_COMMUNITY): Payer: Self-pay

## 2022-01-19 ENCOUNTER — Ambulatory Visit: Payer: 59 | Admitting: Obstetrics

## 2022-01-19 ENCOUNTER — Other Ambulatory Visit: Payer: Self-pay

## 2022-01-19 VITALS — BP 136/90 | HR 66 | Wt 149.4 lb

## 2022-01-19 DIAGNOSIS — D219 Benign neoplasm of connective and other soft tissue, unspecified: Secondary | ICD-10-CM

## 2022-01-19 DIAGNOSIS — D500 Iron deficiency anemia secondary to blood loss (chronic): Secondary | ICD-10-CM | POA: Diagnosis not present

## 2022-01-19 DIAGNOSIS — N939 Abnormal uterine and vaginal bleeding, unspecified: Secondary | ICD-10-CM | POA: Diagnosis not present

## 2022-01-19 MED ORDER — MEDROXYPROGESTERONE ACETATE 10 MG PO TABS
10.0000 mg | ORAL_TABLET | Freq: Every day | ORAL | 1 refills | Status: DC
  Filled 2022-01-19: qty 30, 30d supply, fill #0

## 2022-01-19 NOTE — Progress Notes (Addendum)
Subjective:        Alyssa Khan is a 51 y.o. female here for a routine exam.  Current complaints: Heavy periods with clots.  Has been severely anemic down to a Hgb of 6.  She has been placed on iron by her PCP, and her last Hgb was 9.7 in January 2023.Marland Kitchen Patient is requesting definitive surgical management.   Personal health questionnaire:  Is patient Ashkenazi Jewish, have a family history of breast and/or ovarian cancer: yes Is there a family history of uterine cancer diagnosed at age < 84, gastrointestinal cancer, urinary tract cancer, family member who is a Field seismologist syndrome-associated carrier: no Is the patient overweight and hypertensive, family history of diabetes, personal history of gestational diabetes, preeclampsia or PCOS: no Is patient over 64, have PCOS,  family history of premature CHD under age 67, diabetes, smoke, have hypertension or peripheral artery disease:  no At any time, has a partner hit, kicked or otherwise hurt or frightened you?: no Over the past 2 weeks, have you felt down, depressed or hopeless?: no Over the past 2 weeks, have you felt little interest or pleasure in doing things?:no   Gynecologic History Patient's last menstrual period was 12/30/2021. Contraception: none Last Pap: 11-17-2021. Results were: normal Last mammogram: 12-30-2021. Results were: normal  Obstetric History OB History  Gravida Para Term Preterm AB Living  2 2 2     2   SAB IAB Ectopic Multiple Live Births          2    # Outcome Date GA Lbr Len/2nd Weight Sex Delivery Anes PTL Lv  2 Term 02/05/09    M CS-Unspec   LIV  1 Term 10/22/02    M CS-Unspec   LIV    Past Medical History:  Diagnosis Date   Anemia    Hyperlipidemia    Left breast lump    benign fibroadenoma    Past Surgical History:  Procedure Laterality Date   APPENDECTOMY     BREAST BIOPSY Left 11/09/2018   fibroadenoma   BREAST LUMPECTOMY Left 03/30/2010   benign   removal of left breast lump  03/30/2010    per Dr. Fanny Skates     Current Outpatient Medications:    amLODipine (NORVASC) 5 MG tablet, Take 1 tablet (5 mg total) by mouth daily., Disp: 90 tablet, Rfl: 3   atorvastatin (LIPITOR) 40 MG tablet, Take 1 tablet (40 mg total) by mouth daily., Disp: 90 tablet, Rfl: 3   Iron, Ferrous Sulfate, 325 (65 Fe) MG TABS, Take 1 tablet by mouth in the morning and at bedtime., Disp: 60 tablet, Rfl: 5   methylPREDNISolone (MEDROL DOSEPAK) 4 MG TBPK tablet, FOLLOW PATIENT INSTRUCTIONS ON PACKAGE-TAPER DOSE, Disp: 21 each, Rfl: 0 Allergies  Allergen Reactions   Lisinopril Cough    Social History   Tobacco Use   Smoking status: Never   Smokeless tobacco: Never  Substance Use Topics   Alcohol use: No    Family History  Problem Relation Age of Onset   Breast cancer Sister 90   Cancer Other        breast       Review of Systems  Constitutional: negative for fatigue and weight loss Respiratory: negative for cough and wheezing Cardiovascular: negative for chest pain, fatigue and palpitations Gastrointestinal: negative for abdominal pain and change in bowel habits Musculoskeletal:negative for myalgias Neurological: negative for gait problems and tremors Behavioral/Psych: negative for abusive relationship, depression Endocrine: negative for temperature intolerance  Genitourinary:positive for abnormal menstrual periods and vaginal discharge.  Negative for genital lesions, hot flashes, sexual problems  Integument/breast: negative for breast lump, breast tenderness, nipple discharge and skin lesion(s)    Objective:       Wt 149 lb 6.4 oz (67.8 kg)    LMP 12/30/2021    BMI 24.48 kg/m  General:   Alert and no distress  Skin:   no rash or abnormalities  Lungs:   clear to auscultation bilaterally  Heart:   regular rate and rhythm, S1, S2 normal, no murmur, click, rub or gallop  Breasts:   normal without suspicious masses, skin or nipple changes or axillary nodes  Abdomen:  normal  findings: no organomegaly, soft, non-tender and no hernia  Pelvis:  External genitalia: normal general appearance Urinary system: urethral meatus normal and bladder without fullness, nontender Vaginal: normal without tenderness, induration or masses Cervix: normal appearance Adnexa: normal bimanual exam Uterus: anteverted and non-tender, normal size   Lab Review Urine pregnancy test Labs reviewed yes Radiologic studies reviewed yes   US Pelvic Complete With Transvaginal (Accession [id number]) (Order [id number]) Imaging Date: 11/24/2021 Department: [facility] Released By: Noralyn Pick Authorizing: Laurey Morale, MD   Exam Status  Status  Final [99]   PACS Intelerad Image Link   Show images for US Pelvic Complete With Transvaginal Study Result  Narrative & Impression  CLINICAL DATA:  Possible fibroid on superior uterus felt on routine exam. Pelvic mass in female.   EXAM: TRANSABDOMINAL AND TRANSVAGINAL ULTRASOUND OF PELVIS   TECHNIQUE: Both transabdominal and transvaginal ultrasound examinations of the pelvis were performed. Transabdominal technique was performed for global imaging of the pelvis including uterus, ovaries, adnexal regions, and pelvic cul-de-sac. It was necessary to proceed with endovaginal exam following the transabdominal exam to visualize the uterus, endometrium and adnexa.   COMPARISON:  Report from remote early OB ultrasound 07/18/2007   FINDINGS: Uterus   Measurements: 12.8 x 8.5 x 9.2 cm = volume: 522 mL. The uterus is anteverted and enlarged. Diffuse heterogeneity with multiple discrete fibroids. Dominant hypoechoic fibroid is slightly exophytic arising from the superior fundus measuring 4.9 x 5.7 x 5.7 cm. A 2.5 x 2.7 x 2.6 cm fibroid in the anterior mid uterine body may be submucosal and abuts the endometrium. There is a 3.5 x 2.6 x 2.3 cm fibroid in the right anterior uterus that is subserosal.  Additional fibroids that are not well-defined by ultrasound. Nabothian cyst in the cervix.   Endometrium   Thickness: 10.6 mm.  No focal abnormality visualized.   Right ovary   Not visualized on transabdominal or transvaginal exam. No adnexal mass.   Left ovary   Measurements: 3.7 x 2.4 x 2.3 cm = volume: 10.7 mL. Normal quiescent appearance. No cyst or solid lesion. No adnexal mass.   Other findings   No abnormal free fluid.   IMPRESSION: 1. Enlarged heterogeneous fibroid uterus. Dominant fibroid is slightly exophytic and subserosal arising from the superior fundus measuring 5.7 cm. A least 1 of these fibroids appears to be submucosal measuring 2.6 cm. 2. Quiescent appearance the left ovary.  Right ovary is not seen.     Electronically Signed   By: Keith Rake M.D.   On: 11/25/2021 11:47      Latest Reference Range & Units 11/24/21 11:41 12/01/21 08:03  Iron 42 - 145 ug/dL 42 - 145 ug/dL  37 (L) 37 (L)  TIBC 250.0 - 450.0 mcg/dL  459.2 (H)  Saturation Ratios 20.0 - 50.0 %  8.1 (L)  Ferritin 10.0 - 291.0 ng/mL  29.7  Transferrin 212.0 - 360.0 mg/dL  328.0  Folate >5.9 ng/mL  10.3  Vitamin B12 211 - 911 pg/mL  894  WBC 4.0 - 10.5 K/uL  4.2  RBC 3.87 - 5.11 Mil/uL  4.90  Hemoglobin 12.0 - 15.0 g/dL  9.7 (L)  HCT 36.0 - 46.0 %  32.0 (L)  MCV 78.0 - 100.0 fl  65.3 Repeated and verified X2. (L)  MCHC 30.0 - 36.0 g/dL  30.2  RDW 11.5 - 15.5 %  31.1 (H)  Platelets 150.0 - 400.0 K/uL  293.0  Neutrophils 43.0 - 77.0 %  49.2  Lymphocytes 12.0 - 46.0 %  41.8  Monocytes Relative 3.0 - 12.0 %  7.3  Eosinophil 0.0 - 5.0 %  1.3  Basophil 0.0 - 3.0 %  0.4  NEUT# 1.4 - 7.7 K/uL  2.1  Lymphocyte # 0.7 - 4.0 K/uL  1.7  Monocyte # 0.1 - 1.0 K/uL  0.3  Eosinophils Absolute 0.0 - 0.7 K/uL  0.1  Basophils Absolute 0.0 - 0.1 K/uL  0.0  US PELVIC COMPLETE WITH TRANSVAGINAL  Rpt   (L): Data is abnormally low (H): Data is abnormally high Rpt: View report in Results Review  for more information   I have spent a total of 20 minutes of face-to-face time, excluding clinical staff time, reviewing notes and preparing to see patient, ordering tests and/or medications, and counseling the patient.    Assessment:    1. Abnormal uterine bleeding (AUB) - will try to suppress menstruation to conserve Hgb levels until patient has surgery Rx: - medroxyPROGESTERone (PROVERA) 10 MG tablet; Take 1 tablet (10 mg total) by mouth daily.  Dispense: 30 tablet; Refill: 1  2. Fibroids  3. Iron deficiency anemia due to chronic blood loss - taking iron and vitamins    Plan:    Education reviewed: calcium supplements, depression evaluation, low fat, low cholesterol diet, safe sex/STD prevention, self breast exams, and weight bearing exercise. Contraception: none. Follow up in: 2 weeks.     Shelly Bombard, MD 01/19/2022 8:40 AM

## 2022-01-19 NOTE — Progress Notes (Signed)
Pt presents for initial GYN visit. She has concerns about anemia, heavy periods with clots and fibroids.

## 2022-02-07 ENCOUNTER — Encounter: Payer: Self-pay | Admitting: Obstetrics and Gynecology

## 2022-02-07 ENCOUNTER — Other Ambulatory Visit (HOSPITAL_COMMUNITY): Payer: Self-pay

## 2022-02-07 ENCOUNTER — Other Ambulatory Visit: Payer: Self-pay

## 2022-02-07 ENCOUNTER — Ambulatory Visit (INDEPENDENT_AMBULATORY_CARE_PROVIDER_SITE_OTHER): Payer: 59 | Admitting: Obstetrics and Gynecology

## 2022-02-07 DIAGNOSIS — D219 Benign neoplasm of connective and other soft tissue, unspecified: Secondary | ICD-10-CM | POA: Insufficient documentation

## 2022-02-07 DIAGNOSIS — N939 Abnormal uterine and vaginal bleeding, unspecified: Secondary | ICD-10-CM | POA: Diagnosis not present

## 2022-02-07 MED ORDER — MEGESTROL ACETATE 40 MG PO TABS
40.0000 mg | ORAL_TABLET | Freq: Two times a day (BID) | ORAL | 5 refills | Status: DC
  Filled 2022-02-07: qty 60, 15d supply, fill #0
  Filled 2022-03-07: qty 60, 15d supply, fill #1

## 2022-02-07 NOTE — Progress Notes (Signed)
Alyssa Khan presents in referral from Dr C. Harper for evaluation of TVH due to heavy cycles unresponsive to medication and uterine fibroids.  ?Monthly cycles but heavy with clots. ?H/O anemia thought to be related to cycles ?GYN U/S uterine fibroids, 3-5 cm, vol 522 ? ?H/O LTCS x 2 ? ?H/O HTN, controlled and managed by PCP ? ?Pap smear UTD ? ?No bowel or bladder dysfunction ? ?PE AF VSS ?Chaperone present during exam ? ?Lungs clear Heart RRR ?Abd soft + BS ?GU Nl EGBUS enlarged uterus @ 14 weeks, mildly tender, no adnexal masses ? ?A/P AUB ?       Uterine Fibroids ?       Anemia secondary to above ? ?Pt desires definite therapy. TVH with possible salpingectomy reviewed with pt. R/B/Post op care reviewed. Information provided. Hysterectomy papers signed. Pt desires to proceed. F/U with post op appt. Will d/c Provera and start Megace 40 mg bid til surgery.  ?

## 2022-02-07 NOTE — Progress Notes (Addendum)
51 y.o GYN presents for [facility] Surgical Consult for AUB, she has been bleeding since 01/25/22 and changing her pad every hour, pain 9/10, clots, light headedness x  2 weeks. ?[place] was done 11/24/21 ?

## 2022-02-07 NOTE — Patient Instructions (Signed)
Vaginal Hysterectomy, Care After The following information offers guidance on how to care for yourself after your procedure. Your health care provider may also give you more specific instructions. If you have problems or questions, contact your health care provider. What can I expect after the procedure? After the procedure, it is common to have: Pain in the lower abdomen and vagina. Vaginal bleeding and discharge for up to 1 week. You will need to use a sanitary pad after this procedure. Difficulty having a bowel movement (constipation). Temporary problems emptying the bladder. Tiredness (fatigue). Poor appetite. Less interest in sex. Feelings of sadness or other emotions. If your ovaries were also removed, it is also common to have symptoms of menopause, such as hot flashes, night sweats, and lack of sleep (insomnia). Follow these instructions at home: Medicines  Take over-the-counter and prescription medicines only as told by your health care provider. Do not take aspirin or NSAIDs, such as ibuprofen. These medicines can cause bleeding. Ask your health care provider if the medicine prescribed to you: Requires you to avoid driving or using machinery. Can cause constipation. You may need to take these actions to prevent or treat constipation: Drink enough fluid to keep your urine pale yellow. Take over-the-counter or prescription medicines. Eat foods that are high in fiber, such as beans, whole grains, and fresh fruits and vegetables. Limit foods that are high in fat and processed sugars, such as fried or sweet foods. Activity  Rest as told by your health care provider. Return to your normal activities as told by your health care provider. Ask your health care provider what activities are safe for you Avoid sitting for a long time without moving. Get up to take short walks every 1-2 hours. This is important to improve blood flow and breathing. Ask for help if you feel weak or  unsteady. Try to have someone home with you for 1-2 weeks to help you with everyday chores. Do not lift anything that is heavier than 10 lb (4.5 kg), or the limit that you are told, until your health care provider says that it is safe. If you were given a sedative during the procedure, it can affect you for several hours. Do not drive or operate machinery until your health care provider says that it is safe. Lifestyle Do not use any products that contain nicotine or tobacco. These products include cigarettes, chewing tobacco, and vaping devices, such as e-cigarettes. These can delay healing after surgery. If you need help quitting, ask your health care provider. Do not drink alcohol until your health care provider approves. General instructions Do not douche, use tampons, or have sex for at least 6 weeks, or as told by your health care provider. If you struggle with physical or emotional changes after your procedure, speak with your health care provider or a therapist. The stitches inside your vagina will dissolve over time and do not need to be taken out. Do not take baths, swim, or use a hot tub until your health care provider approves. You may only be allowed to take showers for 2-3 weeks Wear compression stockings as told by your health care provider. These stockings help to prevent blood clots and reduce swelling in your legs. Keep all follow-up visits. This is important. Contact a health care provider if: Your pain medicine is not helping. You have a fever. You have nausea or vomiting that does not go away. You feel dizzy. You have blood, pus, or a bad-smelling discharge from your vagina  more than 1 week after the procedure. You continue to have trouble urinating 3-5 days after the procedure. Get help right away if: You have severe pain in your abdomen or back. You faint. You have heavy vaginal bleeding and blood clots, soaking through a sanitary pad in less than 1 hour. You have chest  pain or shortness of breath. You have pain, swelling, or redness in your leg. These symptoms may represent a serious problem that is an emergency. Do not wait to see if the symptoms will go away. Get medical help right away. Call your local emergency services (911 in the [place] Do not drive yourself to the hospital. Summary After the procedure, it is common to have pain, vaginal bleeding, constipation, temporary problems emptying your bladder, and feelings of sadness or other emotions. Take over-the-counter and prescription medicines only as told by your health care provider. Rest as told by your health care provider. Return to your normal activities as told by your health care provider. Contact a health care provider if your pain medicine is not helping, or you have a fever, dizziness, or trouble urinating several days after the procedure. Get help right away if you have severe pain in your abdomen or back, or if you faint, have heavy bleeding, or have chest pain or shortness of breath. This information is not intended to replace advice given to you by your health care provider. Make sure you discuss any questions you have with your health care provider. Document Revised: 07/17/2020 Document Reviewed: 07/17/2020 Elsevier Patient Education  2022 [place]. Vaginal Hysterectomy A vaginal hysterectomy is a procedure to remove all or part of the uterus through a small incision in the vagina. In this procedure, your health care provider may remove your entire uterus, including the cervix. The cervix is the opening and bottom part of the uterus and is located between the vagina and the uterus. Sometimes, the ovaries and fallopian tubes are also removed. This surgery may be done to treat problems such as: Noncancerous growths in the uterus (uterine fibroids) that cause symptoms. A condition that causes the lining of the uterus to grow in other areas (endometriosis). Problems with pelvic  support. Cancer of the cervix, ovaries, uterus, or tissue that lines the uterus (endometrium). Excessive bleeding in the uterus. When removing your uterus, your health care provider may also remove the ovaries and the fallopian tubes. After this procedure, you will no longer be able to have a baby, and you will no longer have a menstrual period. Tell a health care provider about: Any allergies you have. All medicines you are taking, including vitamins, herbs, eye drops, creams, and over-the-counter medicines. Any problems you or family members have had with anesthetic medicines. Any blood disorders you have. Any surgeries you have had. Any medical conditions you have. Whether you are pregnant or may be pregnant. What are the risks? Generally, this is a safe procedure. However, problems may occur, including: Bleeding. Infection. Blood clots in the legs or lungs. Damage to nearby structures or organs. Pain during sex. Allergic reactions to medicines. What happens before the procedure? Staying hydrated Follow instructions from your health care provider about hydration, which may include: Up to 2 hours before the procedure - you may continue to drink clear liquids, such as water, clear fruit juice, black coffee, and plain tea.  Eating and drinking restrictions Follow instructions from your health care provider about eating and drinking, which may include: 8 hours before the procedure - stop eating heavy  meals or foods, such as meat, fried foods, or fatty foods. 6 hours before the procedure - stop eating light meals or foods, such as toast or cereal. 6 hours before the procedure - stop drinking milk or drinks that contain milk. 2 hours before the procedure - stop drinking clear liquids. Medicines Ask your health care provider about: Changing or stopping your regular medicines. This is especially important if you are taking diabetes medicines or blood thinners. Taking medicines such as  aspirin and ibuprofen. These medicines can thin your blood. Do not take these medicines unless your health care provider tells you to take them. Taking over-the-counter medicines, vitamins, herbs, and supplements. You may be asked to take a medicine to empty your colon (bowel preparation). General instructions If you were asked to do a bowel preparation before the procedure, follow instructions from your health care provider. This procedure can affect the way you feel about yourself. Talk with your health care provider about the physical and emotional changes hysterectomy may cause. Do not use any products that contain nicotine or tobacco for at least 4 weeks before the procedure. These products include cigarettes, e-cigarettes, and chewing tobacco. If you need help quitting, ask your health care provider. Plan to have a responsible adult take you home from the hospital or clinic. Plan to have a responsible adult care for you for the time you are told after you leave the hospital or clinic. This is important. Surgery safety Ask your health care provider: How your surgery site will be marked. What steps will be taken to help prevent infection. These may include: Removing hair at the surgery site. Washing skin with a germ-killing soap. Receiving antibiotic medicine. What happens during the procedure? An IV will be inserted into one of your veins. You will be given one or more of the following: A medicine to help you relax (sedative). A medicine to numb the area (local anesthetic). A medicine to make you fall asleep (general anesthetic). A medicine that is injected into your spine to numb the area below and slightly above the injection site (spinal anesthetic). A medicine that is injected into an area of your body to numb everything below the injection site (regional anesthetic). Your surgeon will make an incision in your vagina. Your surgeon will locate and remove all or part of your uterus.  Part or all of the uterus will be removed through the vagina. Your ovaries and fallopian tubes may be removed at the same time. The incision in your vagina will be closed with stitches (sutures) that dissolve over time. The procedure may vary among health care providers and hospitals. What happens after the procedure? Your blood pressure, heart rate, breathing rate, and blood oxygen level will be monitored until you leave the hospital or clinic. You will be encouraged to walk as soon as possible. You will also use a device or do breathing exercises to keep your lungs clear. You may have to wear compression stockings. These stockings help to prevent blood clots and reduce swelling in your legs. You will be given pain medicine as needed. You will need to wear a sanitary pad for vaginal discharge or bleeding. Summary A vaginal hysterectomy is a procedure to remove all or part of the uterus through the vagina. You may need a vaginal hysterectomy to treat a variety of abnormalities of the uterus. Plan to have a responsible adult take you home from the hospital or clinic. Plan to have a responsible adult care for you for  the time you are told after you leave the hospital or clinic. This is important. This information is not intended to replace advice given to you by your health care provider. Make sure you discuss any questions you have with your health care provider. Document Revised: 07/17/2020 Document Reviewed: 07/17/2020 Elsevier Patient Education  [place].

## 2022-02-14 ENCOUNTER — Encounter: Payer: Self-pay | Admitting: Family Medicine

## 2022-02-14 ENCOUNTER — Ambulatory Visit: Payer: 59 | Admitting: Family Medicine

## 2022-02-14 VITALS — BP 128/80 | HR 74 | Temp 98.8°F | Wt 149.1 lb

## 2022-02-14 DIAGNOSIS — R221 Localized swelling, mass and lump, neck: Secondary | ICD-10-CM

## 2022-02-14 NOTE — Progress Notes (Signed)
? ?  Subjective:  ? ? Patient ID: Alyssa Khan, female    DOB: 1971-05-11, 51 y.o.   MRN: [id number] ? ?HPI ?Here for a small slightly tender lump in the neck that she noticed yesterday. No ST or trouble swallowing. No recent dental work.  ? ? ?Review of Systems  ?Constitutional: Negative.   ?HENT: Negative.    ?Eyes: Negative.   ?Respiratory: Negative.    ? ?   ?Objective:  ? Physical Exam ?Constitutional:   ?   Appearance: Normal appearance. She is not ill-appearing.  ?HENT:  ?   Right Ear: Tympanic membrane, ear canal and external ear normal.  ?   Left Ear: Tympanic membrane, ear canal and external ear normal.  ?   Nose: Nose normal.  ?   Mouth/Throat:  ?   Comments: She has some dental disease but no gum issues  ?Eyes:  ?   Conjunctiva/sclera: Conjunctivae normal.  ?Neck:  ?   Comments: There is a single <1 cm mobile slightly tender lump in the anterior neck in the midline just superior to the thyroid. It is difficult to tell if this is part of thyroid or is separate  ?Cardiovascular:  ?   Rate and Rhythm: Normal rate and regular rhythm.  ?   Pulses: Normal pulses.  ?   Heart sounds: Normal heart sounds.  ?Pulmonary:  ?   Effort: Pulmonary effort is normal.  ?   Breath sounds: Normal breath sounds.  ?Neurological:  ?   Mental Status: She is alert.  ? ? ? ? ? ?   ?Assessment & Plan:  ?Neck lump, most likely a lymph node reacting to some dental diease. I recommended she see her dentist asap. We will also set up a thyroid US to see if this is part of the gland or not.  ?[name], MD ? ? ?

## 2022-02-22 ENCOUNTER — Ambulatory Visit
Admission: RE | Admit: 2022-02-22 | Discharge: 2022-02-22 | Disposition: A | Discharge status: Home / Self Care | Payer: 59 | Source: Ambulatory Visit | Attending: Family Medicine | Admitting: Family Medicine

## 2022-02-22 DIAGNOSIS — E041 Nontoxic single thyroid nodule: Secondary | ICD-10-CM | POA: Diagnosis not present

## 2022-02-22 DIAGNOSIS — R221 Localized swelling, mass and lump, neck: Secondary | ICD-10-CM

## 2022-02-25 ENCOUNTER — Telehealth: Payer: Self-pay | Admitting: Family Medicine

## 2022-02-25 NOTE — Telephone Encounter (Signed)
Patient called because she would like to know the results of her imaging procedures. I let patient know that Dr.Fry had not read them yet, but I would let them know that she wanted a call regarding them. ? ? ? ? ? ?Please advise  ?

## 2022-02-28 NOTE — Telephone Encounter (Signed)
Results were reviewed with patient on 02/25/22.   Message complete. ?

## 2022-03-07 ENCOUNTER — Other Ambulatory Visit (HOSPITAL_COMMUNITY): Payer: Self-pay

## 2022-03-07 ENCOUNTER — Telehealth: Payer: Self-pay | Admitting: Emergency Medicine

## 2022-03-07 NOTE — Telephone Encounter (Signed)
TC to patient to discuss dates for FMLA paper work. ?Pt also c/o pain 10/10 and requesting pain medication.  ?Pt informed of megace refill available at pharmacy. ? ?Message sent to MD, to request pain medication for pt.  ?

## 2022-03-08 ENCOUNTER — Other Ambulatory Visit: Payer: Self-pay | Admitting: Emergency Medicine

## 2022-03-08 ENCOUNTER — Other Ambulatory Visit (HOSPITAL_COMMUNITY): Payer: Self-pay

## 2022-03-08 MED ORDER — IBUPROFEN 800 MG PO TABS
800.0000 mg | ORAL_TABLET | Freq: Three times a day (TID) | ORAL | 1 refills | Status: DC | PRN
  Filled 2022-03-08: qty 30, 10d supply, fill #0

## 2022-03-14 NOTE — H&P (Signed)
Alyssa Khan is an 51 y.o. female with heavy cycles and uterine fibroids. ?Has tried hormonal manipulation without success.  ?GYN U/S uterine fibroids, vol 522 gms. ? ?H/O LTCS x 2 ? ?Pap smear UTD ? ? ?Menstrual History: ?Menarche age: 68 ?No LMP recorded. ?  ? ?Past Medical History:  ?Diagnosis Date  ? Anemia   ? Hyperlipidemia   ? Left breast lump   ? benign fibroadenoma  ? ? ?Past Surgical History:  ?Procedure Laterality Date  ? APPENDECTOMY    ? BREAST BIOPSY Left 11/09/2018  ? fibroadenoma  ? BREAST LUMPECTOMY Left 03/30/2010  ? benign  ? removal of left breast lump  03/30/2010  ? per Dr. Fanny Skates  ? ? ?Family History  ?Problem Relation Age of Onset  ? Breast cancer Sister 60  ? Cancer Other   ?     breast   ? ? ?Social History:  reports that she has never smoked. She has never used smokeless tobacco. She reports that she does not drink alcohol and does not use drugs. ? ?Allergies:  ?Allergies  ?Allergen Reactions  ? Lisinopril Cough  ? ? ?No medications prior to admission.  ? ? ?Review of Systems  ?Constitutional: Negative.   ?Respiratory: Negative.    ?Cardiovascular: Negative.   ?Gastrointestinal: Negative.   ?Genitourinary:  Positive for menstrual problem.  ? ?There were no vitals taken for this visit. ?Physical Exam ?Constitutional:   ?   Appearance: Normal appearance.  ?Cardiovascular:  ?   Rate and Rhythm: Normal rate and regular rhythm.  ?Pulmonary:  ?   Effort: Pulmonary effort is normal.  ?   Breath sounds: Normal breath sounds.  ?Abdominal:  ?   General: Bowel sounds are normal.  ?   Palpations: Abdomen is soft.  ?Genitourinary: ?   Comments: Nl EGBUS, enlarged uterus @ 14 weeks, mobile, no masses ? ? ?No results found for this or any previous visit (from the past 24 hour(s)). ? ?No results found. ? ?Assessment/Plan: ?Uterine fibroids ? ?Pt desires definite therapy. TVH with possible BS reviewed with pt. R/B/Post op care discussed. Pt agrees to proceed. ? ?Chancy Milroy ?03/14/2022, 6:27  PM ? ?

## 2022-03-21 ENCOUNTER — Other Ambulatory Visit: Payer: Self-pay

## 2022-03-21 ENCOUNTER — Encounter (HOSPITAL_COMMUNITY): Payer: Self-pay | Admitting: Obstetrics and Gynecology

## 2022-03-21 NOTE — Progress Notes (Signed)
Alyssa Khan denies chest pain or shortness of breath. Patient denies having any s/s of Covid in her household.  Patient denies any known exposure to Covid.  ? ?PCP is Dr. Delma Freeze. ? ?I instructed Alyssa Khan   to shower with antibacteria soap.  Do not shave. No nail polish, artificial or acrylic nails. Wear clean clothes, brush your teeth. ?Glasses, contact lens,dentures or partials may not be worn in the OR. If you need to wear them, please bring a case for glasses, do not wear contacts or bring a case, the hospital does not have contact cases, dentures or partials will have to be removed , make sure they are clean, we will provide a denture cup to put them in. You will need some one to drive you home and a responsible person over the age of 59 to stay with you for the first 24 hours after surgery.  ?

## 2022-03-22 ENCOUNTER — Observation Stay (HOSPITAL_COMMUNITY)
Admission: RE | Admit: 2022-03-22 | Discharge: 2022-03-23 | Disposition: A | Discharge status: Home / Self Care | Payer: 59 | Attending: Obstetrics and Gynecology | Admitting: Obstetrics and Gynecology

## 2022-03-22 ENCOUNTER — Other Ambulatory Visit: Payer: Self-pay

## 2022-03-22 ENCOUNTER — Encounter (HOSPITAL_COMMUNITY): Payer: Self-pay | Admitting: Obstetrics and Gynecology

## 2022-03-22 ENCOUNTER — Encounter (HOSPITAL_COMMUNITY)
Admission: RE | Disposition: A | Discharge status: Home / Self Care | Payer: Self-pay | Source: Home / Self Care | Attending: Obstetrics and Gynecology

## 2022-03-22 ENCOUNTER — Observation Stay (HOSPITAL_COMMUNITY): Payer: 59 | Admitting: Anesthesiology

## 2022-03-22 ENCOUNTER — Observation Stay (HOSPITAL_BASED_OUTPATIENT_CLINIC_OR_DEPARTMENT_OTHER): Payer: 59 | Admitting: Anesthesiology

## 2022-03-22 DIAGNOSIS — D219 Benign neoplasm of connective and other soft tissue, unspecified: Secondary | ICD-10-CM

## 2022-03-22 DIAGNOSIS — Z9889 Other specified postprocedural states: Secondary | ICD-10-CM

## 2022-03-22 DIAGNOSIS — D630 Anemia in neoplastic disease: Secondary | ICD-10-CM | POA: Diagnosis not present

## 2022-03-22 DIAGNOSIS — N920 Excessive and frequent menstruation with regular cycle: Secondary | ICD-10-CM | POA: Diagnosis not present

## 2022-03-22 DIAGNOSIS — D250 Submucous leiomyoma of uterus: Secondary | ICD-10-CM

## 2022-03-22 DIAGNOSIS — D252 Subserosal leiomyoma of uterus: Secondary | ICD-10-CM

## 2022-03-22 DIAGNOSIS — D259 Leiomyoma of uterus, unspecified: Secondary | ICD-10-CM

## 2022-03-22 DIAGNOSIS — N858 Other specified noninflammatory disorders of uterus: Secondary | ICD-10-CM | POA: Insufficient documentation

## 2022-03-22 HISTORY — DX: Other specified postprocedural states: Z98.890

## 2022-03-22 HISTORY — DX: Essential (primary) hypertension: I10

## 2022-03-22 HISTORY — PX: VAGINAL HYSTERECTOMY: SHX2639

## 2022-03-22 LAB — BASIC METABOLIC PANEL
Anion gap: 9 (ref 5–15)
BUN: 9 mg/dL (ref 6–20)
CO2: 20 mmol/L — ABNORMAL LOW (ref 22–32)
Calcium: 9.1 mg/dL (ref 8.9–10.3)
Chloride: 111 mmol/L (ref 98–111)
Creatinine, Ser: 0.8 mg/dL (ref 0.44–1.00)
GFR, Estimated: >60 mL/min (ref >60)
Glucose, Bld: 89 mg/dL (ref 70–99)
Potassium: 3.3 mmol/L — ABNORMAL LOW (ref 3.5–5.1)
Sodium: 140 mmol/L (ref 135–145)

## 2022-03-22 LAB — CBC
HCT: 36.6 % (ref 36.0–46.0)
Hemoglobin: 11.5 g/dL — ABNORMAL LOW (ref 12.0–15.0)
MCH: 25.5 pg — ABNORMAL LOW (ref 26.0–34.0)
MCHC: 31.4 g/dL (ref 30.0–36.0)
MCV: 81.2 fL (ref 80.0–100.0)
Platelets: 311 10*3/uL (ref 150–400)
RBC: 4.51 MIL/uL (ref 3.87–5.11)
RDW: 13.8 % (ref 11.5–15.5)
WBC: 5.1 10*3/uL (ref 4.0–10.5)
nRBC: 0 % (ref 0.0–0.2)

## 2022-03-22 LAB — TYPE AND SCREEN
ABO/RH(D): B POS
Antibody Screen: NEGATIVE

## 2022-03-22 LAB — POCT PREGNANCY, URINE: Preg Test, Ur: NEGATIVE

## 2022-03-22 SURGERY — HYSTERECTOMY, VAGINAL
Anesthesia: General | Site: Vagina | Laterality: Bilateral

## 2022-03-22 MED ORDER — PROPOFOL 10 MG/ML IV BOLUS
INTRAVENOUS | Status: DC | PRN
  Administered 2022-03-22: 150 mg via INTRAVENOUS

## 2022-03-22 MED ORDER — ROCURONIUM BROMIDE 10 MG/ML (PF) SYRINGE
PREFILLED_SYRINGE | INTRAVENOUS | Status: AC
  Filled 2022-03-22: qty 10

## 2022-03-22 MED ORDER — OXYCODONE-ACETAMINOPHEN 5-325 MG PO TABS: 2.0000 | ORAL_TABLET | ORAL | Status: DC | PRN

## 2022-03-22 MED ORDER — LACTATED RINGERS IV SOLN: INTRAVENOUS | Status: DC

## 2022-03-22 MED ORDER — 0.9 % SODIUM CHLORIDE (POUR BTL) OPTIME
TOPICAL | Status: DC | PRN
  Administered 2022-03-22: 1000 mL

## 2022-03-22 MED ORDER — ONDANSETRON HCL 4 MG/2ML IJ SOLN
INTRAMUSCULAR | Status: AC
  Filled 2022-03-22: qty 2

## 2022-03-22 MED ORDER — DEXAMETHASONE SODIUM PHOSPHATE 10 MG/ML IJ SOLN
INTRAMUSCULAR | Status: DC | PRN
  Administered 2022-03-22: 10 mg via INTRAVENOUS

## 2022-03-22 MED ORDER — ZOLPIDEM TARTRATE 5 MG PO TABS: 5.0000 mg | ORAL_TABLET | Freq: Every evening | ORAL | Status: DC | PRN

## 2022-03-22 MED ORDER — SCOPOLAMINE 1 MG/3DAYS TD PT72
MEDICATED_PATCH | TRANSDERMAL | Status: AC
  Administered 2022-03-22: 1.5 mg via TRANSDERMAL
  Filled 2022-03-22: qty 1

## 2022-03-22 MED ORDER — SCOPOLAMINE 1 MG/3DAYS TD PT72
1.0000 | MEDICATED_PATCH | TRANSDERMAL | Status: DC
Start: 2022-03-22 — End: 2022-03-23

## 2022-03-22 MED ORDER — IBUPROFEN 800 MG PO TABS: 800.0000 mg | ORAL_TABLET | Freq: Three times a day (TID) | ORAL | Status: DC

## 2022-03-22 MED ORDER — CHLORHEXIDINE GLUCONATE 0.12 % MT SOLN
15.0000 mL | Freq: Once | OROMUCOSAL | Status: AC
  Administered 2022-03-22: 15 mL via OROMUCOSAL
  Filled 2022-03-22: qty 15

## 2022-03-22 MED ORDER — ONDANSETRON HCL 4 MG/2ML IJ SOLN
INTRAMUSCULAR | Status: DC | PRN
  Administered 2022-03-22: 4 mg via INTRAVENOUS

## 2022-03-22 MED ORDER — SUGAMMADEX SODIUM 200 MG/2ML IV SOLN
INTRAVENOUS | Status: DC | PRN
  Administered 2022-03-22: 200 mg via INTRAVENOUS

## 2022-03-22 MED ORDER — ORAL CARE MOUTH RINSE: 15.0000 mL | Freq: Once | OROMUCOSAL | Status: AC

## 2022-03-22 MED ORDER — FENTANYL CITRATE (PF) 250 MCG/5ML IJ SOLN
INTRAMUSCULAR | Status: AC
  Filled 2022-03-22: qty 5

## 2022-03-22 MED ORDER — FENTANYL CITRATE (PF) 250 MCG/5ML IJ SOLN
INTRAMUSCULAR | Status: DC | PRN
  Administered 2022-03-22: 50 ug via INTRAVENOUS
  Administered 2022-03-22: 150 ug via INTRAVENOUS
  Administered 2022-03-22: 50 ug via INTRAVENOUS

## 2022-03-22 MED ORDER — ROCURONIUM BROMIDE 10 MG/ML (PF) SYRINGE
PREFILLED_SYRINGE | INTRAVENOUS | Status: DC | PRN
  Administered 2022-03-22: 10 mg via INTRAVENOUS
  Administered 2022-03-22: 60 mg via INTRAVENOUS

## 2022-03-22 MED ORDER — AMISULPRIDE (ANTIEMETIC) 5 MG/2ML IV SOLN
10.0000 mg | Freq: Once | INTRAVENOUS | Status: AC | PRN
  Administered 2022-03-22: 10 mg via INTRAVENOUS

## 2022-03-22 MED ORDER — DEXMEDETOMIDINE (PRECEDEX) IN NS 20 MCG/5ML (4 MCG/ML) IV SYRINGE
PREFILLED_SYRINGE | INTRAVENOUS | Status: AC
  Filled 2022-03-22: qty 5

## 2022-03-22 MED ORDER — POVIDONE-IODINE 10 % EX SWAB
2.0000 "application " | Freq: Once | CUTANEOUS | Status: AC
  Administered 2022-03-22: 2 via TOPICAL

## 2022-03-22 MED ORDER — HYDROMORPHONE HCL 1 MG/ML IJ SOLN
1.0000 mg | INTRAMUSCULAR | Status: DC | PRN
  Administered 2022-03-22 (×2): 1 mg via INTRAVENOUS
  Filled 2022-03-22 (×2): qty 1

## 2022-03-22 MED ORDER — HYDROMORPHONE HCL 1 MG/ML IJ SOLN
INTRAMUSCULAR | Status: AC
Start: 2022-03-22 — End: 2022-03-23
  Filled 2022-03-22: qty 1

## 2022-03-22 MED ORDER — AMISULPRIDE (ANTIEMETIC) 5 MG/2ML IV SOLN
INTRAVENOUS | Status: AC
  Filled 2022-03-22: qty 4

## 2022-03-22 MED ORDER — KETOROLAC TROMETHAMINE 30 MG/ML IJ SOLN: 30.0000 mg | Freq: Four times a day (QID) | INTRAMUSCULAR | Status: DC

## 2022-03-22 MED ORDER — ALBUMIN HUMAN 5 % IV SOLN: INTRAVENOUS | Status: DC | PRN

## 2022-03-22 MED ORDER — OXYCODONE-ACETAMINOPHEN 5-325 MG PO TABS
1.0000 | ORAL_TABLET | ORAL | Status: DC | PRN
  Administered 2022-03-22 – 2022-03-23 (×2): 1 via ORAL
  Filled 2022-03-22 (×2): qty 1

## 2022-03-22 MED ORDER — CEFAZOLIN SODIUM-DEXTROSE 2-4 GM/100ML-% IV SOLN
2.0000 g | INTRAVENOUS | Status: AC
  Administered 2022-03-22: 2 g via INTRAVENOUS
  Filled 2022-03-22: qty 100

## 2022-03-22 MED ORDER — ONDANSETRON HCL 4 MG PO TABS
4.0000 mg | ORAL_TABLET | Freq: Four times a day (QID) | ORAL | Status: DC | PRN
  Administered 2022-03-23: 4 mg via ORAL
  Filled 2022-03-22: qty 1

## 2022-03-22 MED ORDER — KETOROLAC TROMETHAMINE 15 MG/ML IJ SOLN
INTRAMUSCULAR | Status: DC | PRN
  Administered 2022-03-22: 15 mg via INTRAVENOUS

## 2022-03-22 MED ORDER — PROPOFOL 10 MG/ML IV BOLUS
INTRAVENOUS | Status: AC
  Filled 2022-03-22: qty 20

## 2022-03-22 MED ORDER — SENNA 8.6 MG PO TABS: 1.0000 | ORAL_TABLET | Freq: Two times a day (BID) | ORAL | Status: DC

## 2022-03-22 MED ORDER — MIDAZOLAM HCL 2 MG/2ML IJ SOLN
INTRAMUSCULAR | Status: DC | PRN
  Administered 2022-03-22: 2 mg via INTRAVENOUS

## 2022-03-22 MED ORDER — SIMETHICONE 80 MG PO CHEW: 80.0000 mg | CHEWABLE_TABLET | Freq: Four times a day (QID) | ORAL | Status: DC | PRN

## 2022-03-22 MED ORDER — HYDROMORPHONE HCL 1 MG/ML IJ SOLN
INTRAMUSCULAR | Status: AC
  Filled 2022-03-22: qty 1

## 2022-03-22 MED ORDER — ACETAMINOPHEN 500 MG PO TABS
1000.0000 mg | ORAL_TABLET | ORAL | Status: AC
  Administered 2022-03-22: 1000 mg via ORAL
  Filled 2022-03-22: qty 2

## 2022-03-22 MED ORDER — DEXMEDETOMIDINE (PRECEDEX) IN NS 20 MCG/5ML (4 MCG/ML) IV SYRINGE
PREFILLED_SYRINGE | INTRAVENOUS | Status: DC | PRN
  Administered 2022-03-22: 8 ug via INTRAVENOUS

## 2022-03-22 MED ORDER — DEXAMETHASONE SODIUM PHOSPHATE 10 MG/ML IJ SOLN
INTRAMUSCULAR | Status: AC
  Filled 2022-03-22: qty 1

## 2022-03-22 MED ORDER — MIDAZOLAM HCL 2 MG/2ML IJ SOLN
INTRAMUSCULAR | Status: AC
  Filled 2022-03-22: qty 2

## 2022-03-22 MED ORDER — SOD CITRATE-CITRIC ACID 500-334 MG/5ML PO SOLN
30.0000 mL | ORAL | Status: AC
  Administered 2022-03-22: 30 mL via ORAL
  Filled 2022-03-22: qty 30

## 2022-03-22 MED ORDER — KETOROLAC TROMETHAMINE 15 MG/ML IJ SOLN
15.0000 mg | INTRAMUSCULAR | Status: AC
  Administered 2022-03-22: 15 mg via INTRAVENOUS
  Filled 2022-03-22: qty 1

## 2022-03-22 MED ORDER — LIDOCAINE 2% (20 MG/ML) 5 ML SYRINGE
INTRAMUSCULAR | Status: DC | PRN
  Administered 2022-03-22: 60 mg via INTRAVENOUS

## 2022-03-22 MED ORDER — HYDROMORPHONE HCL 1 MG/ML IJ SOLN
0.2500 mg | INTRAMUSCULAR | Status: DC | PRN
  Administered 2022-03-22 (×3): 0.5 mg via INTRAVENOUS

## 2022-03-22 MED ORDER — ONDANSETRON HCL 4 MG/2ML IJ SOLN
4.0000 mg | Freq: Four times a day (QID) | INTRAMUSCULAR | Status: DC | PRN
  Administered 2022-03-22 – 2022-03-23 (×2): 4 mg via INTRAVENOUS
  Filled 2022-03-22 (×3): qty 2

## 2022-03-22 MED ORDER — LIDOCAINE 2% (20 MG/ML) 5 ML SYRINGE
INTRAMUSCULAR | Status: AC
  Filled 2022-03-22: qty 5

## 2022-03-22 MED ORDER — ONDANSETRON HCL 4 MG/2ML IJ SOLN: 4.0000 mg | Freq: Once | INTRAMUSCULAR | Status: DC | PRN

## 2022-03-22 SURGICAL SUPPLY — 26 items
BAG COUNTER SPONGE SURGICOUNT (BAG) ×2 IMPLANT
BLADE SURG 10 STRL SS (BLADE) ×7 IMPLANT
CANISTER SUCT 3000ML PPV (MISCELLANEOUS) ×2 IMPLANT
GAUZE 4X4 16PLY ~~LOC~~+RFID DBL (SPONGE) ×3 IMPLANT
GLOVE BIO SURGEON STRL SZ7.5 (GLOVE) ×2 IMPLANT
GLOVE BIOGEL PI IND STRL 6.5 (GLOVE) ×1 IMPLANT
GLOVE BIOGEL PI IND STRL 8 (GLOVE) ×1 IMPLANT
GLOVE BIOGEL PI INDICATOR 6.5 (GLOVE) ×1
GLOVE BIOGEL PI INDICATOR 8 (GLOVE) ×1
GLOVE SURG UNDER POLY LF SZ7 (GLOVE) ×4 IMPLANT
GOWN STRL REUS W/ TWL LRG LVL3 (GOWN DISPOSABLE) ×3 IMPLANT
GOWN STRL REUS W/ TWL XL LVL3 (GOWN DISPOSABLE) ×1 IMPLANT
GOWN STRL REUS W/TWL LRG LVL3 (GOWN DISPOSABLE) ×3
GOWN STRL REUS W/TWL XL LVL3 (GOWN DISPOSABLE) ×1
KIT TURNOVER KIT B (KITS) ×2 IMPLANT
NS IRRIG 1000ML POUR BTL (IV SOLUTION) ×2 IMPLANT
PACK VAGINAL WOMENS (CUSTOM PROCEDURE TRAY) ×2 IMPLANT
PAD OB MATERNITY 4.3X12.25 (PERSONAL CARE ITEMS) ×2 IMPLANT
SUT VIC AB 2-0 CT1 18 (SUTURE) ×2 IMPLANT
SUT VIC AB 2-0 CT1 27 (SUTURE) ×1
SUT VIC AB 2-0 CT1 TAPERPNT 27 (SUTURE) ×1 IMPLANT
SUT VIC AB PLUS 45CM 1-MO-4 (SUTURE) ×5 IMPLANT
SUT VICRYL 1 TIES 12X18 (SUTURE) ×2 IMPLANT
TOWEL GREEN STERILE FF (TOWEL DISPOSABLE) ×4 IMPLANT
TRAY FOLEY W/BAG SLVR 14FR (SET/KITS/TRAYS/PACK) ×2 IMPLANT
UNDERPAD 30X36 HEAVY ABSORB (UNDERPADS AND DIAPERS) ×2 IMPLANT

## 2022-03-22 NOTE — Op Note (Signed)
Alyssa Khan ?PROCEDURE DATE: 03/22/2022 ? ?PREOPERATIVE DIAGNOSIS:  Symptomatic fibroids, menorrhagia ?POSTOPERATIVE DIAGNOSIS:  Symptomatic fibroids, menorrhagia ?SURGEON:   Arlina Robes, M.D. ?ASSISTANTLeland Johns, M.D. ? ?An experienced assistant was required given the standard of surgical care given the complexity of the case.  This assistant was needed for exposure, dissection, suctioning, retraction, instrument exchange, and for overall help during the procedure. ? ? ?OPERATION:  Total Vaginal hysterectomy and bilateral salpingectomy ? ?ANESTHESIA:  General endotracheal. ? ?INDICATIONS: The patient is a 51 y.o. [id number] with history of symptomatic uterine fibroids/menorrhagia. The patient made a decision to undergo definite surgical treatment. On the preoperative visit, the risks, benefits, indications, and alternatives of the procedure were reviewed with the patient.  On the day of surgery, the risks of surgery were again discussed with the patient including but not limited to: bleeding which may require transfusion or reoperation; infection which may require antibiotics; injury to bowel, bladder, ureters or other surrounding organs; need for additional procedures; thromboembolic phenomenon, incisional problems and other postoperative/anesthesia complications. Written informed consent was obtained.   ? ?OPERATIVE FINDINGS: A 16 week size uterus with normal tubes and ovaries bilaterally. ? ?ESTIMATED BLOOD LOSS: 550 ml ?FLUIDS:  As recorded ?URINE OUTPUT:  As recorded ?SPECIMENS:  Uterus and cervix and tubes sent to pathology. Uterine weight 645 gms ?COMPLICATIONS:  None immediate. ? ?DESCRIPTION OF PROCEDURE:  The patient received intravenous antibiotics and had sequential compression devices applied to her lower extremities while in the preoperative area.  She was then taken to the operating room where general anesthesia was administered and was found to be adequate.  She was placed in the dorsal lithotomy  position, and was prepped and draped in a sterile manner.  A Foley catheter was inserted into her bladder and attached to constant drainage. After an adequate timeout was performed, attention was turned to her pelvis.  A weighted speculum was then placed in the vagina, and the posterior lip of the cervix was grasped  with tenaculum.  The posterior cul de sac was entered sharply without problems. Long bill weighted speculum was placed. The anterior lip of the cervix was grasped with tenaculum.   The cervix was then circumferentially incised, and the bladder was dissected off the pubocervical fascia anteriorly without complication.  The anterior cul-de-sac was then entered sharply without difficulty and a retractor was placed.   Zeplin clamps were then used to clamp the uterosacral ligaments on either side.  They were then cut and sutured ligated with 0 Vicryl.  Of note, all sutures used in this case were 0 Vicryl unless otherwise noted.   The cardinal ligaments were then clamped, cut and ligated. The uterine vessels and broad ligaments were then serially clamped with the Zeplin clamps, cut, and suture ligated on both sides.  Excellent hemostasis was noted at this point.  The uterus was then morcellated using a bivalve tenchique. Several subserosal and submucosa fibroids were removed. The final pedical involving the uterovarian was clamped. Cut and ligated with a free tie and in a [place] fashion. This was repeated on the opposite size. The uterus was then removed and sent to pathology.  Each fallopian tube was identified and clamped, cut and ligated with a free tie.   After completion of the hysterectomy, all pedicles from the uterosacral ligament to the cornua were examined hemostasis was confirmed.  The peritoneum was then closed in a purse string fashion with 2/0 Vicryl.The vaginal cuff was then closed with 2/0 Vicryl.  All instruments  were then removed from the pelvis.  The patient tolerated the procedure well.   All instruments, needles, and sponge counts were correct x 2. The patient was taken to the recovery room in stable condition.   ? ?Arlina Robes MD, FACOG ?Attending Obstetrician & Gynecologist ?[facility] [place]  ?

## 2022-03-22 NOTE — Interval H&P Note (Signed)
History and Physical Interval Note: ? ?03/22/2022 ?9:43 AM ? ?Alyssa Khan  has presented today for surgery, with the diagnosis of Menorrhagia ?Fibroids.  The various methods of treatment have been discussed with the patient and family. After consideration of risks, benefits and other options for treatment, the patient has consented to  Procedure(s): ?HYSTERECTOMY VAGINAL WITH SALPINGECTOMY (Bilateral) as a surgical intervention.  The patient's history has been reviewed, patient examined, no change in status, stable for surgery.  I have reviewed the patient's chart and labs.  Questions were answered to the patient's satisfaction.   ? ? ?Chancy Milroy ? ? ?

## 2022-03-22 NOTE — Anesthesia Procedure Notes (Signed)
Procedure Name: Intubation ?Date/Time: 03/22/2022 10:01 AM ?Performed by: Michele Rockers, CRNA ?Pre-anesthesia Checklist: Patient identified, Patient being monitored, Timeout performed, Emergency Drugs available and Suction available ?Patient Re-evaluated:Patient Re-evaluated prior to induction ?Oxygen Delivery Method: Circle System Utilized ?Preoxygenation: Pre-oxygenation with 100% oxygen ?Induction Type: IV induction ?Ventilation: Mask ventilation without difficulty ?Laryngoscope Size: Sabra Heck and 2 ?Grade View: Grade I ?Tube type: Oral ?Tube size: 7.0 mm ?Number of attempts: 1 ?Airway Equipment and Method: Stylet ?Placement Confirmation: ETT inserted through vocal cords under direct vision, positive ETCO2 and breath sounds checked- equal and bilateral ?Secured at: 21 cm ?Tube secured with: Tape ?Dental Injury: Teeth and Oropharynx as per pre-operative assessment  ? ? ? ? ?

## 2022-03-22 NOTE — Transfer of Care (Signed)
Immediate Anesthesia Transfer of Care Note ? ?Patient: Alyssa Khan ? ?Procedure(s) Performed: TOTAL VAGINAL HYSTERECTOMY WITH MORSELLATION, BILATERAL SALPINGECTOMY (Bilateral: Vagina ) ? ?Patient Location: PACU ? ?Anesthesia Type:General ? ?Level of Consciousness: drowsy, patient cooperative and responds to stimulation ? ?Airway & Oxygen Therapy: Patient Spontanous Breathing and Patient connected to nasal cannula oxygen ? ?Post-op Assessment: Report given to RN and Post -op Vital signs reviewed and stable ? ?Post vital signs: Reviewed and stable ? ?Last Vitals:  ?Vitals Value Taken Time  ?BP 114/67 03/22/22 1235  ?Temp    ?Pulse 73 03/22/22 1238  ?Resp 12 03/22/22 1238  ?SpO2 100 % 03/22/22 1238  ?Vitals shown include unvalidated device data. ? ?Last Pain:  ?Vitals:  ? 03/22/22 0745  ?TempSrc:   ?PainSc: 0-No pain  ?   ? ?  ? ?Complications: No notable events documented. ?

## 2022-03-22 NOTE — Anesthesia Preprocedure Evaluation (Signed)
Anesthesia Evaluation  ?Patient identified by MRN, date of birth, ID band ?Patient awake ? ? ? ?Reviewed: ?Allergy & Precautions, NPO status , Patient's Chart, lab work & pertinent test results, reviewed documented beta blocker date and time  ? ?History of Anesthesia Complications ?(+) PONV and history of anesthetic complications ? ?Airway ?Mallampati: II ? ?TM Distance: >3 FB ?Neck ROM: Full ? ? ? Dental ? ?(+) Teeth Intact, Dental Advisory Given ?  ?Pulmonary ? ?Hx/o PPD + 2008 ?  ?Pulmonary exam normal ?breath sounds clear to auscultation ? ? ? ? ? ? Cardiovascular ?hypertension, Pt. on medications ?Normal cardiovascular exam ?Rhythm:Regular Rate:Normal ? ? ?  ?Neuro/Psych ?negative neurological ROS ? negative psych ROS  ? GI/Hepatic ?negative GI ROS, Neg liver ROS,   ?Endo/Other  ?Hyperlipidemia ? Renal/GU ?negative Renal ROS  ?negative genitourinary ?  ?Musculoskeletal ?negative musculoskeletal ROS ?(+)  ? Abdominal ?  ?Peds ? Hematology ? ?(+) Blood dyscrasia, anemia ,   ?Anesthesia Other Findings ? ? Reproductive/Obstetrics ?Menorrhagia ?Fibroid uterus ? ?  ? ? ? ? ? ? ? ? ? ? ? ? ? ?  ?  ? ? ? ? ? ? ? ? ?Anesthesia Physical ?Anesthesia Plan ? ?ASA: 2 ? ?Anesthesia Plan:   ? ?Post-op Pain Management: Ketamine IV*, Dilaudid IV, Tylenol PO (pre-op)* and Precedex  ? ?Induction: Intravenous ? ?PONV Risk Score and Plan: 4 or greater and Treatment may vary due to age or medical condition, Scopolamine patch - Pre-op, Midazolam, Ondansetron and Dexamethasone ? ?Airway Management Planned: Oral ETT ? ?Additional Equipment: None ? ?Intra-op Plan:  ? ?Post-operative Plan: Extubation in OR ? ?Informed Consent: I have reviewed the patients History and Physical, chart, labs and discussed the procedure including the risks, benefits and alternatives for the proposed anesthesia with the patient or authorized representative who has indicated his/her understanding and acceptance.  ? ? ? ?Dental  advisory given ? ?Plan Discussed with: Anesthesiologist and CRNA ? ?Anesthesia Plan Comments:   ? ? ? ? ? ? ?Anesthesia Quick Evaluation ? ?

## 2022-03-23 ENCOUNTER — Other Ambulatory Visit (HOSPITAL_COMMUNITY): Payer: Self-pay

## 2022-03-23 ENCOUNTER — Encounter (HOSPITAL_COMMUNITY): Payer: Self-pay | Admitting: Obstetrics and Gynecology

## 2022-03-23 DIAGNOSIS — D250 Submucous leiomyoma of uterus: Secondary | ICD-10-CM | POA: Diagnosis not present

## 2022-03-23 DIAGNOSIS — N858 Other specified noninflammatory disorders of uterus: Secondary | ICD-10-CM | POA: Diagnosis not present

## 2022-03-23 DIAGNOSIS — D259 Leiomyoma of uterus, unspecified: Secondary | ICD-10-CM | POA: Diagnosis not present

## 2022-03-23 DIAGNOSIS — D252 Subserosal leiomyoma of uterus: Secondary | ICD-10-CM | POA: Diagnosis not present

## 2022-03-23 DIAGNOSIS — N920 Excessive and frequent menstruation with regular cycle: Secondary | ICD-10-CM | POA: Diagnosis not present

## 2022-03-23 LAB — BASIC METABOLIC PANEL
Anion gap: 8 (ref 5–15)
BUN: 6 mg/dL (ref 6–20)
CO2: 20 mmol/L — ABNORMAL LOW (ref 22–32)
Calcium: 8.9 mg/dL (ref 8.9–10.3)
Chloride: 108 mmol/L (ref 98–111)
Creatinine, Ser: 0.72 mg/dL (ref 0.44–1.00)
GFR, Estimated: >60 mL/min (ref >60)
Glucose, Bld: 124 mg/dL — ABNORMAL HIGH (ref 70–99)
Potassium: 3.7 mmol/L (ref 3.5–5.1)
Sodium: 136 mmol/L (ref 135–145)

## 2022-03-23 LAB — CBC
HCT: 26.3 % — ABNORMAL LOW (ref 36.0–46.0)
Hemoglobin: 8.6 g/dL — ABNORMAL LOW (ref 12.0–15.0)
MCH: 25.5 pg — ABNORMAL LOW (ref 26.0–34.0)
MCHC: 32.7 g/dL (ref 30.0–36.0)
MCV: 78 fL — ABNORMAL LOW (ref 80.0–100.0)
Platelets: 289 10*3/uL (ref 150–400)
RBC: 3.37 MIL/uL — ABNORMAL LOW (ref 3.87–5.11)
RDW: 13.5 % (ref 11.5–15.5)
WBC: 8.8 10*3/uL (ref 4.0–10.5)
nRBC: 0 % (ref 0.0–0.2)

## 2022-03-23 MED ORDER — OXYCODONE-ACETAMINOPHEN 5-325 MG PO TABS
1.0000 | ORAL_TABLET | ORAL | 0 refills | Status: DC | PRN
Start: 2022-03-23 — End: 2022-11-23
  Filled 2022-03-23: qty 30, 5d supply, fill #0

## 2022-03-23 MED ORDER — IBUPROFEN 800 MG PO TABS
800.0000 mg | ORAL_TABLET | Freq: Three times a day (TID) | ORAL | 0 refills | Status: AC | PRN
  Filled 2022-03-23: qty 30, 10d supply, fill #0

## 2022-03-23 MED ORDER — ONDANSETRON HCL 4 MG PO TABS
4.0000 mg | ORAL_TABLET | Freq: Four times a day (QID) | ORAL | 0 refills | Status: DC | PRN
  Filled 2022-03-23: qty 20, 5d supply, fill #0

## 2022-03-23 MED ORDER — ACETAMINOPHEN 325 MG PO TABS
650.0000 mg | ORAL_TABLET | Freq: Four times a day (QID) | ORAL | Status: DC | PRN
  Administered 2022-03-23: 650 mg via ORAL
  Filled 2022-03-23: qty 2

## 2022-03-23 NOTE — Discharge Summary (Signed)
Physician Discharge Summary  ?Patient ID: ?Alyssa Khan ?MRN: [id number] ?DOB/AGE: 04/22/1971 51 y.o. ? ?Admit date: 03/22/2022 ?Discharge date: 03/23/2022 ? ?Admission Diagnoses: Uterine fibroids ? ?Discharge Diagnoses:  ?Principal Problem: ?  Post-operative state ? ? ?Discharged Condition: good ? ?Hospital Course: Pt was admitted with above Dx. She underwent TVH with BS without problems. See OP note for additional information. Post op course was unremarkable, She progressed to ambulating, tolerating diet and good oral pain control. ?Felt amendable for discharge home. Discharge instructions, medications and follow up reviewed with pt. Pt verbalized understanding.  ? ?Consults: None ? ?Significant Diagnostic Studies: labs ? ?Treatments: surgery: TVH with BS ? ?Discharge Exam: ?Blood pressure (!) 114/59, pulse 70, temperature 98 ?F (36.7 ?C), temperature source Oral, resp. rate 16, height '5\' 5"'$  (1.651 m), weight 67.6 kg, SpO2 99 %. ?Lungs clear Heart RRR ?Abd soft + BS ?GU no bleeding ?Ext non tender ? ?Disposition: Discharge disposition: 01-Home or Self Care ? ? ? ? ? ? ?Discharge Instructions   ? ? Call MD for:  difficulty breathing, headache or visual disturbances   Complete by: As directed ?  ? Call MD for:  extreme fatigue   Complete by: As directed ?  ? Call MD for:  hives   Complete by: As directed ?  ? Call MD for:  persistant dizziness or light-headedness   Complete by: As directed ?  ? Call MD for:  persistant nausea and vomiting   Complete by: As directed ?  ? Call MD for:  redness, tenderness, or signs of infection (pain, swelling, redness, odor or green/yellow discharge around incision site)   Complete by: As directed ?  ? Call MD for:  severe uncontrolled pain   Complete by: As directed ?  ? Call MD for:  temperature >100.4   Complete by: As directed ?  ? Diet - low sodium heart healthy   Complete by: As directed ?  ? Increase activity slowly   Complete by: As directed ?  ? Sexual Activity Restrictions    Complete by: As directed ?  ? Pelvic rest x 4 weeks  ? ?  ? ?Allergies as of 03/23/2022   ? ?   Reactions  ? Ibuprofen   ? Bleeding   ? Lisinopril Cough  ? ?  ? ?  ?Medication List  ?  ? ?STOP taking these medications   ? ?megestrol 40 MG tablet ?Commonly known as: MEGACE ?  ? ?  ? ?TAKE these medications   ? ?amLODipine 5 MG tablet ?Commonly known as: NORVASC ?Take 1 tablet (5 mg total) by mouth daily. ?  ?atorvastatin 40 MG tablet ?Commonly known as: LIPITOR ?Take 1 tablet (40 mg total) by mouth daily. ?  ?ibuprofen 800 MG tablet ?Commonly known as: ADVIL ?Take 1 tablet (800 mg total) by mouth every 8 (eight) hours as needed. ?What changed:  ?when to take this ?reasons to take this ?  ?Iron (Ferrous Sulfate) 325 (65 Fe) MG Tabs ?Take 1 tablet by mouth in the morning and at bedtime. ?  ?ondansetron 4 MG tablet ?Commonly known as: ZOFRAN ?Take 1 tablet (4 mg total) by mouth every 6 (six) hours as needed for nausea. ?  ?oxyCODONE-acetaminophen 5-325 MG tablet ?Commonly known as: PERCOCET/ROXICET ?Take 1 tablet by mouth every 4 (four) hours as needed for moderate pain. ?  ? ?  ? ? Follow-up Information   ? ? [place]. Schedule an appointment as soon as possible for a visit in  4 week(s).   ?Why: Post op appt with Dr. Gardiner Fanti, face to face ?Contact information: ?[address] ?[place] [address] ?[phone] ? ?  ?  ? ?  ?  ? ?  ? ? ?Signed: ?Chancy Milroy ?03/23/2022, 7:47 AM ? ? ?

## 2022-03-23 NOTE — Anesthesia Postprocedure Evaluation (Signed)
Anesthesia Post Note ? ?Patient: Alyssa Khan ? ?Procedure(s) Performed: TOTAL VAGINAL HYSTERECTOMY WITH MORSELLATION, BILATERAL SALPINGECTOMY (Bilateral: Vagina ) ? ?  ? ?Patient location during evaluation: PACU ?Anesthesia Type: General ?Level of consciousness: awake and alert ?Pain management: pain level controlled ?Vital Signs Assessment: post-procedure vital signs reviewed and stable ?Respiratory status: spontaneous breathing, nonlabored ventilation, respiratory function stable and patient connected to nasal cannula oxygen ?Cardiovascular status: blood pressure returned to baseline and stable ?Postop Assessment: no apparent nausea or vomiting ?Anesthetic complications: no ? ? ?No notable events documented. ? ?Last Vitals:  ?Vitals:  ? 03/23/22 0316 03/23/22 0815  ?BP: (!) 114/59 133/61  ?Pulse: 70 75  ?Resp: 16 16  ?Temp: 36.7 ?C 36.7 ?C  ?SpO2: 99% 98%  ?  ?Last Pain:  ?Vitals:  ? 03/23/22 0820  ?TempSrc:   ?PainSc: 6   ? ? ?  ?  ?  ?  ?  ?  ? ?Suzette Battiest E ? ? ? ? ?

## 2022-03-24 LAB — SURGICAL PATHOLOGY

## 2022-04-15 ENCOUNTER — Encounter: Payer: Self-pay | Admitting: Obstetrics and Gynecology

## 2022-04-15 ENCOUNTER — Ambulatory Visit (INDEPENDENT_AMBULATORY_CARE_PROVIDER_SITE_OTHER): Payer: 59 | Admitting: Obstetrics and Gynecology

## 2022-04-15 VITALS — BP 138/86 | HR 76 | Ht 65.0 in | Wt 143.0 lb

## 2022-04-15 DIAGNOSIS — Z9889 Other specified postprocedural states: Secondary | ICD-10-CM | POA: Diagnosis not present

## 2022-04-15 DIAGNOSIS — Z90710 Acquired absence of both cervix and uterus: Secondary | ICD-10-CM

## 2022-04-15 DIAGNOSIS — Z9079 Acquired absence of other genital organ(s): Secondary | ICD-10-CM | POA: Insufficient documentation

## 2022-04-15 NOTE — Progress Notes (Signed)
Ms Alyssa Khan presents for post op appt. S/P TVH with BS on 4/25. Pathology reviewed with pt Denies any bowel or bladder dysfunction Tolerating diet. Some fatigue and vaginal discharge No vaginal bleeding  PE AF VSS Chaperone present during exam  Lungs clear Heart RRR Abd soft + BS GU Nl EGBUS, cuff healing well, no masses or tenderness  A/P Post OP         S/P TVH/BS  Doing well. Will check CBC. Continue with pelvic rest x 2 weeks. Return to nl ADL's as tolerates. F/U in 1 yr or PRN

## 2022-04-15 NOTE — Patient Instructions (Signed)

## 2022-04-15 NOTE — Progress Notes (Addendum)
51 y.o GYN presents for PostOp FU of TVH/BS.  C/o fatigue and pressure x 5-7/10, vaginal itching, discharge.

## 2022-04-16 LAB — CBC
Hematocrit: 31.3 % — ABNORMAL LOW (ref 34.0–46.6)
Hemoglobin: 9.9 g/dL — ABNORMAL LOW (ref 11.1–15.9)
MCH: 24.2 pg — ABNORMAL LOW (ref 26.6–33.0)
MCHC: 31.6 g/dL (ref 31.5–35.7)
MCV: 77 fL — ABNORMAL LOW (ref 79–97)
Platelets: 443 10*3/uL (ref 150–450)
RBC: 4.09 x10E6/uL (ref 3.77–5.28)
RDW: 14.4 % (ref 11.7–15.4)
WBC: 5.5 10*3/uL (ref 3.4–10.8)

## 2022-04-18 ENCOUNTER — Encounter: Payer: Self-pay | Admitting: Obstetrics and Gynecology

## 2022-04-20 ENCOUNTER — Other Ambulatory Visit: Payer: Self-pay

## 2022-04-21 ENCOUNTER — Other Ambulatory Visit (HOSPITAL_COMMUNITY): Payer: Self-pay

## 2022-07-19 ENCOUNTER — Ambulatory Visit: Payer: 59 | Admitting: Family Medicine

## 2022-07-19 DIAGNOSIS — H524 Presbyopia: Secondary | ICD-10-CM | POA: Diagnosis not present

## 2022-07-19 DIAGNOSIS — H52223 Regular astigmatism, bilateral: Secondary | ICD-10-CM | POA: Diagnosis not present

## 2022-07-19 DIAGNOSIS — H5203 Hypermetropia, bilateral: Secondary | ICD-10-CM | POA: Diagnosis not present

## 2022-11-23 ENCOUNTER — Ambulatory Visit (INDEPENDENT_AMBULATORY_CARE_PROVIDER_SITE_OTHER): Payer: 59 | Admitting: Family Medicine

## 2022-11-23 ENCOUNTER — Encounter: Payer: Self-pay | Admitting: Family Medicine

## 2022-11-23 ENCOUNTER — Other Ambulatory Visit (HOSPITAL_COMMUNITY): Payer: Self-pay

## 2022-11-23 VITALS — BP 130/100 | HR 98 | Temp 98.5°F | Ht 65.0 in | Wt 150.0 lb

## 2022-11-23 DIAGNOSIS — Z0000 Encounter for general adult medical examination without abnormal findings: Secondary | ICD-10-CM

## 2022-11-23 LAB — BASIC METABOLIC PANEL
BUN: 10 mg/dL (ref 6–23)
CO2: 27 mEq/L (ref 19–32)
Calcium: 9.6 mg/dL (ref 8.4–10.5)
Chloride: 105 mEq/L (ref 96–112)
Creatinine, Ser: 0.78 mg/dL (ref 0.40–1.20)
GFR: 89.33 mL/min (ref >60.00)
Glucose, Bld: 91 mg/dL (ref 70–99)
Potassium: 4 mEq/L (ref 3.5–5.1)
Sodium: 140 mEq/L (ref 135–145)

## 2022-11-23 LAB — CBC WITH DIFFERENTIAL/PLATELET
Basophils Absolute: 0 10*3/uL (ref 0.0–0.1)
Basophils Relative: 0.4 % (ref 0.0–3.0)
Eosinophils Absolute: 0 10*3/uL (ref 0.0–0.7)
Eosinophils Relative: 0.8 % (ref 0.0–5.0)
HCT: 42.3 % (ref 36.0–46.0)
Hemoglobin: 14 g/dL (ref 12.0–15.0)
Lymphocytes Relative: 47.9 % — ABNORMAL HIGH (ref 12.0–46.0)
Lymphs Abs: 2.4 10*3/uL (ref 0.7–4.0)
MCHC: 33.1 g/dL (ref 30.0–36.0)
MCV: 81.9 fl (ref 78.0–100.0)
Monocytes Absolute: 0.3 10*3/uL (ref 0.1–1.0)
Monocytes Relative: 5.6 % (ref 3.0–12.0)
Neutro Abs: 2.3 10*3/uL (ref 1.4–7.7)
Neutrophils Relative %: 45.3 % (ref 43.0–77.0)
Platelets: 219 10*3/uL (ref 150.0–400.0)
RBC: 5.17 Mil/uL — ABNORMAL HIGH (ref 3.87–5.11)
RDW: 14 % (ref 11.5–15.5)
WBC: 5 10*3/uL (ref 4.0–10.5)

## 2022-11-23 LAB — LIPID PANEL
Cholesterol: 249 mg/dL — ABNORMAL HIGH (ref 0–200)
HDL: 57.6 mg/dL
LDL Cholesterol: 171 mg/dL — ABNORMAL HIGH (ref 0–99)
NonHDL: 191.56
Total CHOL/HDL Ratio: 4
Triglycerides: 103 mg/dL (ref 0.0–149.0)
VLDL: 20.6 mg/dL (ref 0.0–40.0)

## 2022-11-23 LAB — HEPATIC FUNCTION PANEL
ALT: 15 U/L (ref 0–35)
AST: 13 U/L (ref 0–37)
Albumin: 4.4 g/dL (ref 3.5–5.2)
Alkaline Phosphatase: 39 U/L (ref 39–117)
Bilirubin, Direct: 0.1 mg/dL (ref 0.0–0.3)
Total Bilirubin: 0.5 mg/dL (ref 0.2–1.2)
Total Protein: 7.2 g/dL (ref 6.0–8.3)

## 2022-11-23 LAB — HEMOGLOBIN A1C: Hgb A1c MFr Bld: 5.6 % (ref 4.6–6.5)

## 2022-11-23 LAB — TSH: TSH: 2.62 u[IU]/mL (ref 0.35–5.50)

## 2022-11-23 MED ORDER — ATORVASTATIN CALCIUM 40 MG PO TABS
40.0000 mg | ORAL_TABLET | Freq: Every day | ORAL | 3 refills | Status: DC
  Filled 2022-11-23: qty 90, 90d supply, fill #0
  Filled 2023-04-06: qty 90, 90d supply, fill #1
  Filled 2023-11-14: qty 90, 90d supply, fill #2

## 2022-11-23 MED ORDER — AMLODIPINE BESYLATE 5 MG PO TABS
5.0000 mg | ORAL_TABLET | Freq: Every day | ORAL | 3 refills | Status: DC
  Filled 2022-11-23: qty 90, 90d supply, fill #0
  Filled 2023-04-06: qty 90, 90d supply, fill #1
  Filled 2023-11-14: qty 90, 90d supply, fill #2

## 2022-11-23 NOTE — Progress Notes (Signed)
   Subjective:    Patient ID: Alyssa Khan, female    DOB: [birth year], 51 y.o.   MRN: [id number]  HPI Here for a well exam. She feels well in general. She does mention having frequent bloating in the abdomen. There is no pain. Her Bm's are regular. She had a TAH and BSO in April. Normallt her BP is stable at home, but she has not taken any medication yet this morning.    Review of Systems  Constitutional: Negative.   HENT: Negative.    Eyes: Negative.   Respiratory: Negative.    Cardiovascular: Negative.   Gastrointestinal:  Positive for abdominal distention.  Genitourinary:  Negative for decreased urine volume, difficulty urinating, dyspareunia, dysuria, enuresis, flank pain, frequency, hematuria, pelvic pain and urgency.  Musculoskeletal: Negative.   Skin: Negative.   Neurological: Negative.  Negative for headaches.  Psychiatric/Behavioral: Negative.         Objective:   Physical Exam Constitutional:      General: She is not in acute distress.    Appearance: Normal appearance. She is well-developed.  HENT:     Head: Normocephalic and atraumatic.     Right Ear: External ear normal.     Left Ear: External ear normal.     Nose: Nose normal.     Mouth/Throat:     Pharynx: No oropharyngeal exudate.  Eyes:     General: No scleral icterus.    Conjunctiva/sclera: Conjunctivae normal.     Pupils: Pupils are equal, round, and reactive to light.  Neck:     Thyroid: No thyromegaly.     Vascular: No JVD.  Cardiovascular:     Rate and Rhythm: Normal rate and regular rhythm.     Heart sounds: Normal heart sounds. No murmur heard.    No friction rub. No gallop.  Pulmonary:     Effort: Pulmonary effort is normal. No respiratory distress.     Breath sounds: Normal breath sounds. No wheezing or rales.  Chest:     Chest wall: No tenderness.  Abdominal:     General: Bowel sounds are normal. There is no distension.     Palpations: Abdomen is soft. There is no mass.      Tenderness: There is no abdominal tenderness. There is no guarding or rebound.  Musculoskeletal:        General: No tenderness. Normal range of motion.     Cervical back: Normal range of motion and neck supple.  Lymphadenopathy:     Cervical: No cervical adenopathy.  Skin:    General: Skin is warm and dry.     Findings: No erythema or rash.  Neurological:     Mental Status: She is alert and oriented to person, place, and time.     Cranial Nerves: No cranial nerve deficit.     Motor: No abnormal muscle tone.     Coordination: Coordination normal.     Deep Tendon Reflexes: Reflexes are normal and symmetric. Reflexes normal.  Psychiatric:        Behavior: Behavior normal.        Thought Content: Thought content normal.        Judgment: Judgment normal.           Assessment & Plan:  Well exam. We discussed diet and exercise. Get fasting labs. I think her HTN is well controlled. For the bloating, I suggested she try a probiotic like Align every day.  [name], MD

## 2022-12-05 ENCOUNTER — Other Ambulatory Visit (HOSPITAL_COMMUNITY): Payer: Self-pay

## 2022-12-08 ENCOUNTER — Other Ambulatory Visit: Payer: Self-pay | Admitting: Family Medicine

## 2022-12-08 DIAGNOSIS — Z1231 Encounter for screening mammogram for malignant neoplasm of breast: Secondary | ICD-10-CM

## 2023-01-30 ENCOUNTER — Ambulatory Visit
Admission: RE | Admit: 2023-01-30 | Discharge: 2023-01-30 | Disposition: A | Discharge status: Home / Self Care | Payer: Commercial Managed Care - PPO | Source: Ambulatory Visit | Attending: Family Medicine | Admitting: Family Medicine

## 2023-01-30 DIAGNOSIS — Z1231 Encounter for screening mammogram for malignant neoplasm of breast: Secondary | ICD-10-CM

## 2023-03-17 IMAGING — MG MM DIGITAL SCREENING BILAT W/ TOMO AND CAD
8 series · 8 of 24 positions shown · non-contrast
Comparison: Previous exam(s).

CLINICAL DATA: Screening.

EXAM:
DIGITAL SCREENING BILATERAL MAMMOGRAM WITH TOMOSYNTHESIS AND CAD
TECHNIQUE: Bilateral screening digital craniocaudal and mediolateral oblique
mammograms were obtained. Bilateral screening digital breast
tomosynthesis was performed. The images were evaluated with
computer-aided detection.

[L MLO synth-2D]
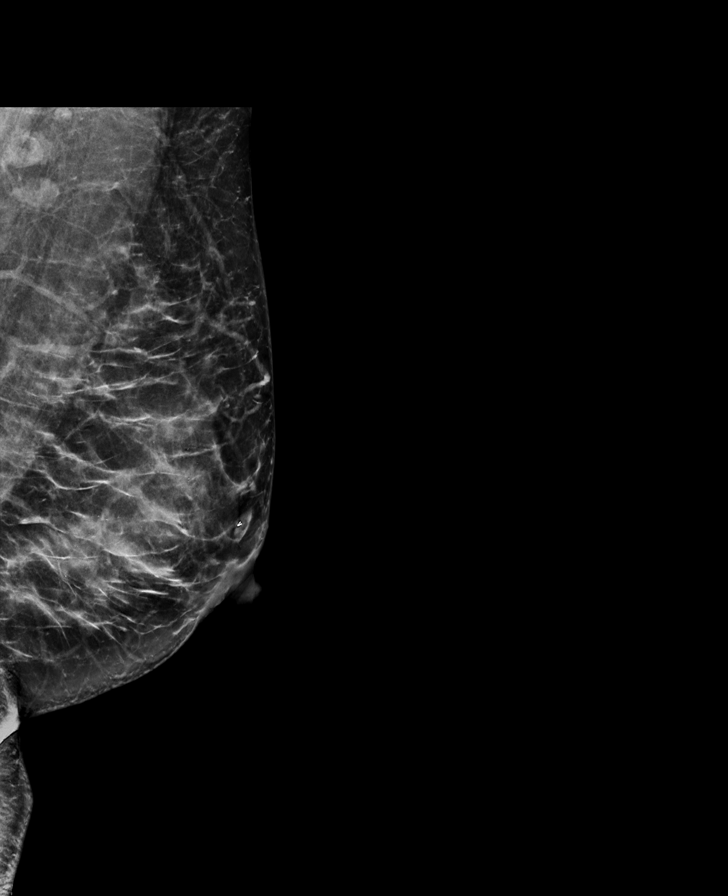

[R MLO synth-2D]
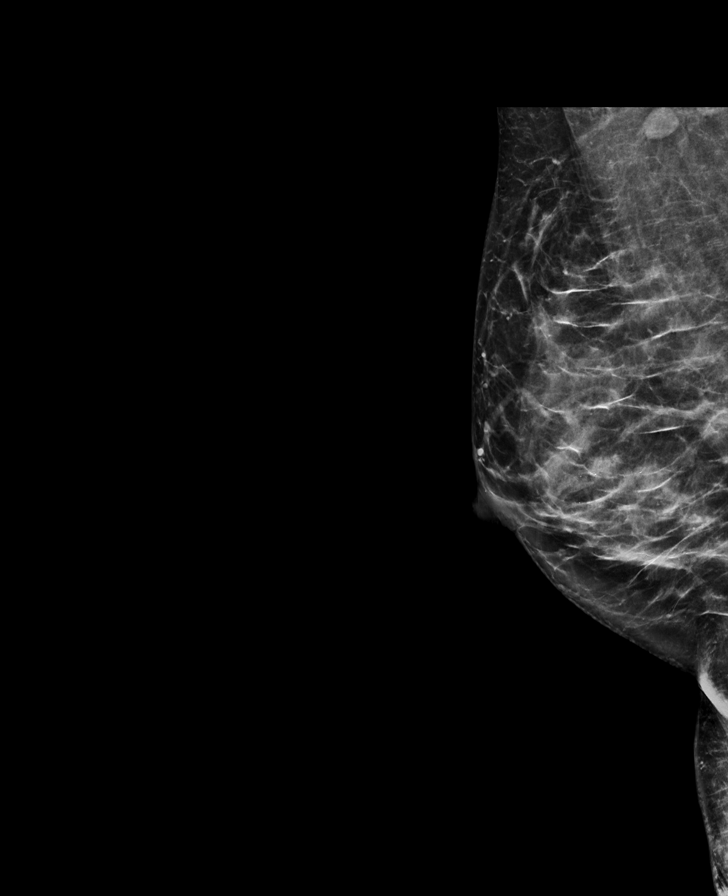

[L CC synth-2D]
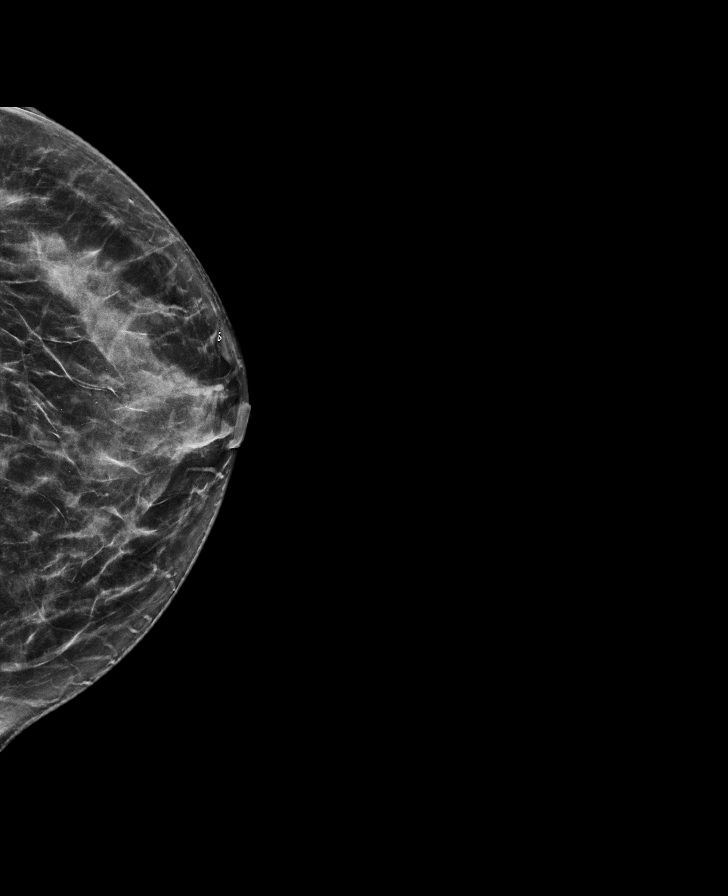

[R CC synth-2D]
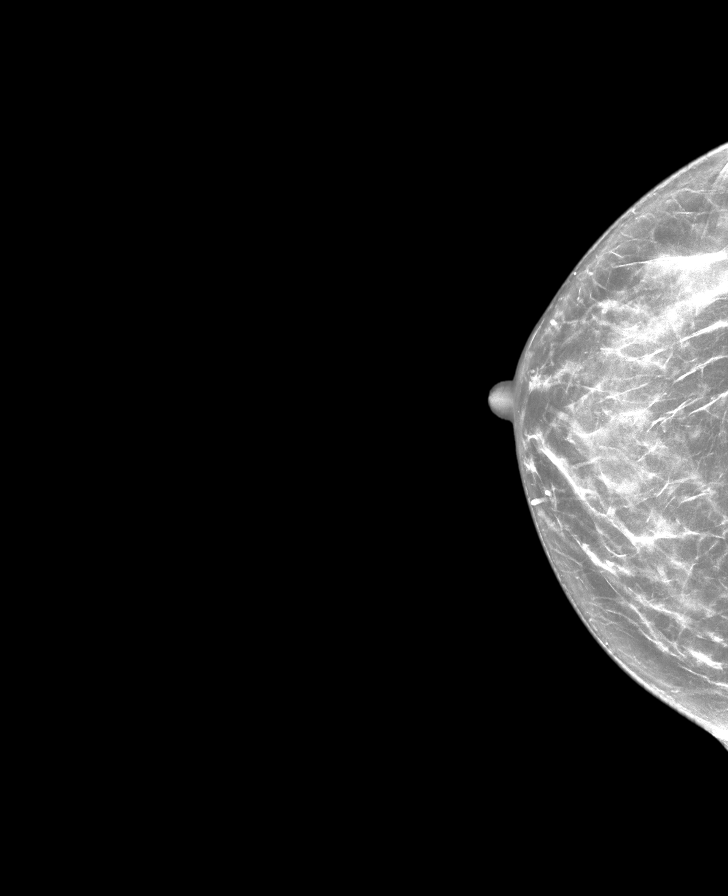

[L MLO tomo · tomo slice 35/70.0]
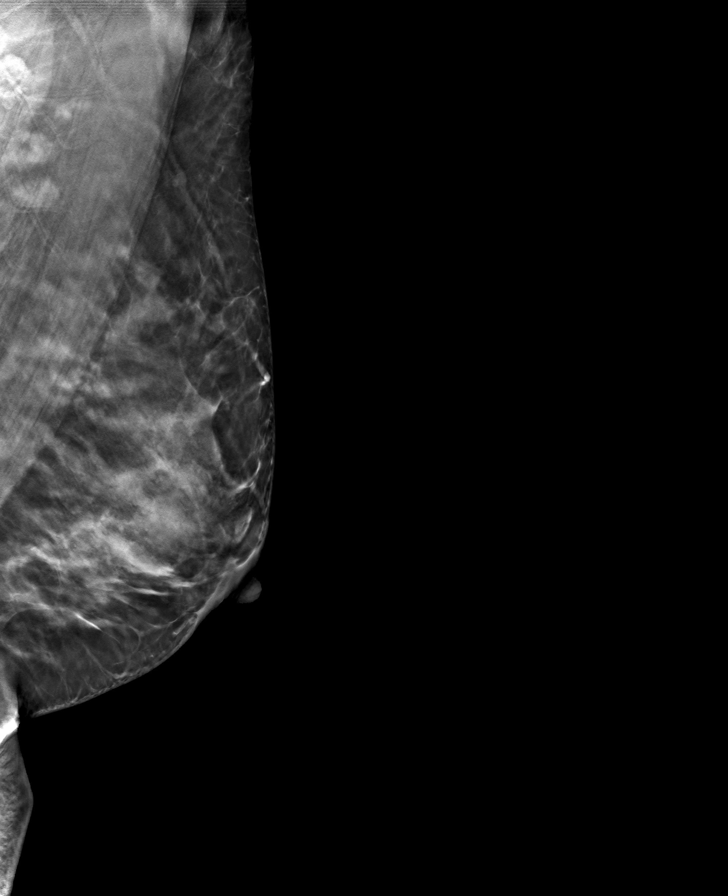

[R CC tomo · tomo slice 33/65.0]
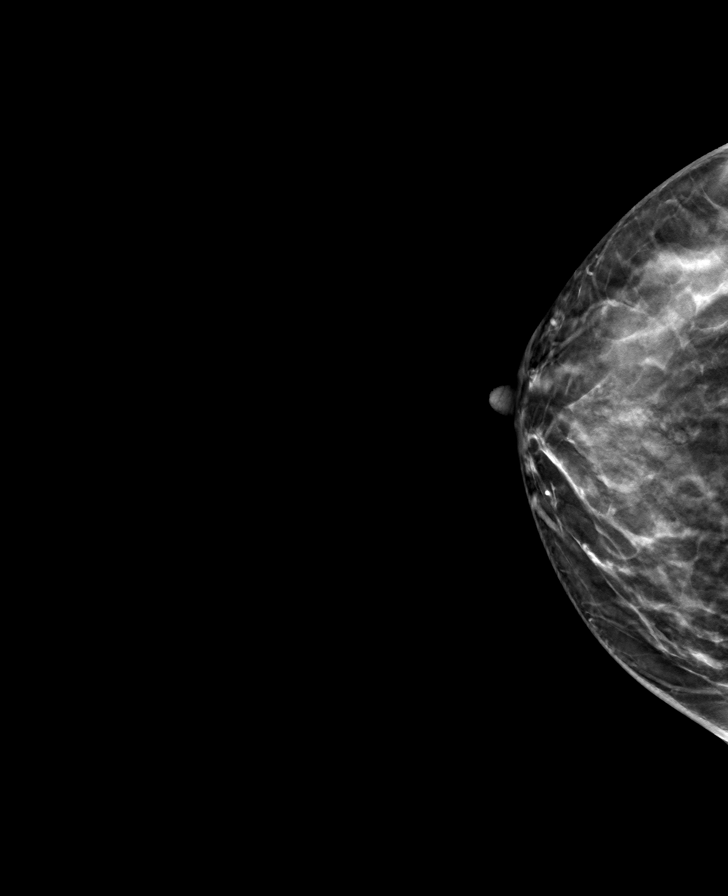

[L CC tomo · tomo slice 33/65.0]
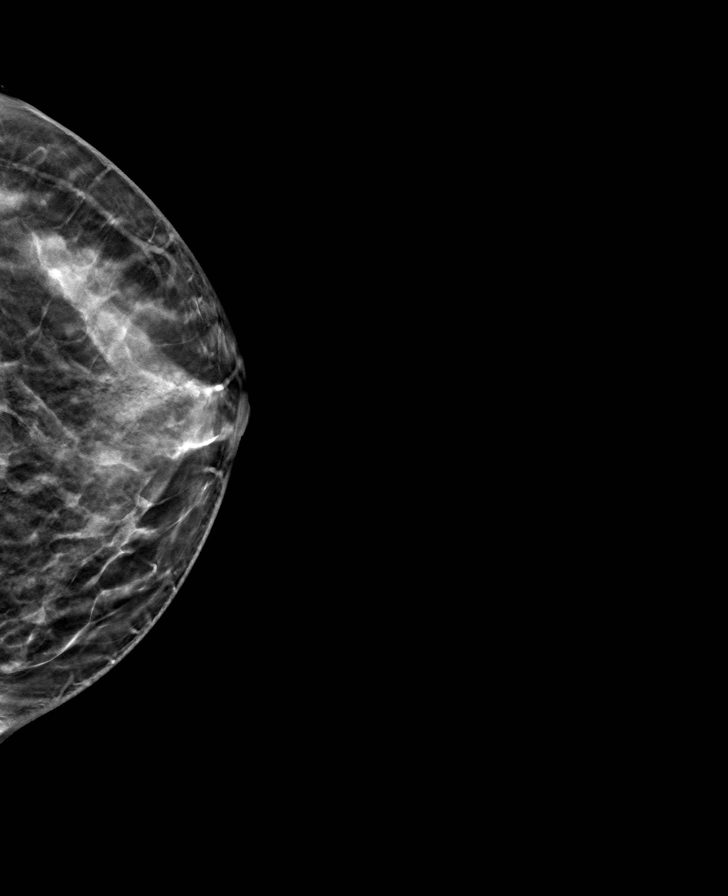

[R MLO tomo · tomo slice 36/71.0]
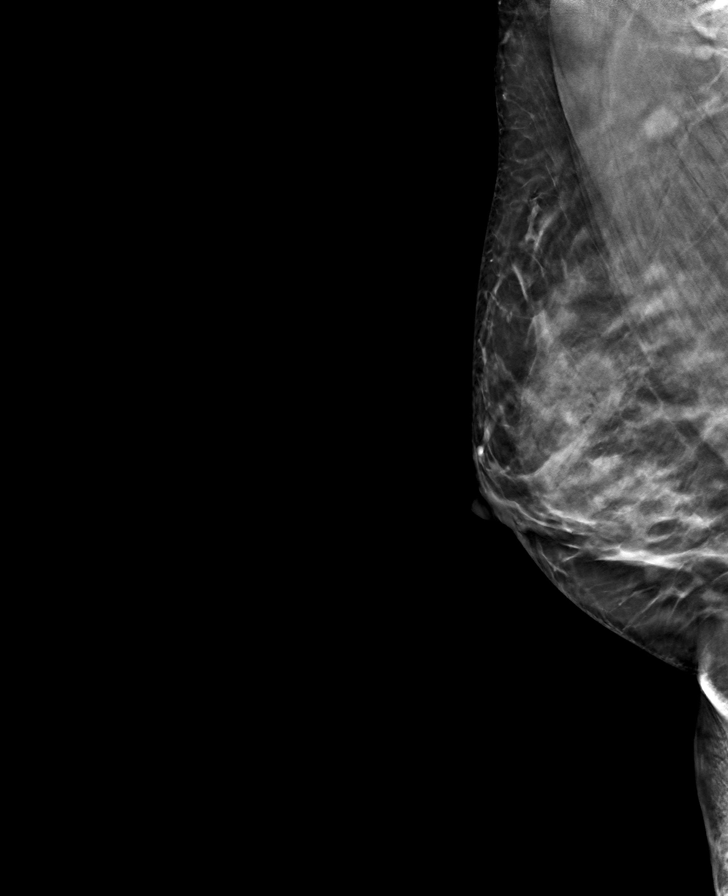

[8 of 24 positions shown; findings below may reference images not displayed]

ACR Breast Density Category d: The breast tissue is extremely dense,
which lowers the sensitivity of mammography
FINDINGS: There are no findings suspicious for malignancy.
IMPRESSION: No mammographic evidence of malignancy. A result letter of this
screening mammogram will be mailed directly to the patient.

RECOMMENDATION:
Screening mammogram in one year. (Code:TA-V-WV9)

BI-RADS CATEGORY  1: Negative.

## 2023-04-07 ENCOUNTER — Other Ambulatory Visit: Payer: Self-pay | Admitting: Family Medicine

## 2023-04-07 ENCOUNTER — Other Ambulatory Visit (HOSPITAL_COMMUNITY): Payer: Self-pay

## 2023-04-07 MED ORDER — CYCLOBENZAPRINE HCL 10 MG PO TABS
10.0000 mg | ORAL_TABLET | Freq: Three times a day (TID) | ORAL | 5 refills | Status: DC | PRN
Start: 2023-04-07 — End: 2024-01-23
  Filled 2023-04-07: qty 90, 30d supply, fill #0

## 2023-07-21 DIAGNOSIS — H5203 Hypermetropia, bilateral: Secondary | ICD-10-CM | POA: Diagnosis not present

## 2023-07-21 DIAGNOSIS — H524 Presbyopia: Secondary | ICD-10-CM | POA: Diagnosis not present

## 2023-07-21 DIAGNOSIS — H52223 Regular astigmatism, bilateral: Secondary | ICD-10-CM | POA: Diagnosis not present

## 2023-08-07 ENCOUNTER — Encounter: Payer: Self-pay | Admitting: Obstetrics and Gynecology

## 2023-08-07 ENCOUNTER — Other Ambulatory Visit (HOSPITAL_COMMUNITY)
Admission: RE | Admit: 2023-08-07 | Discharge: 2023-08-07 | Disposition: A | Discharge status: Home / Self Care | Payer: Commercial Managed Care - PPO | Source: Ambulatory Visit | Attending: Obstetrics and Gynecology | Admitting: Obstetrics and Gynecology

## 2023-08-07 ENCOUNTER — Ambulatory Visit: Payer: Commercial Managed Care - PPO | Admitting: Obstetrics and Gynecology

## 2023-08-07 VITALS — BP 131/88 | HR 97 | Ht 65.0 in | Wt 146.0 lb

## 2023-08-07 DIAGNOSIS — Z01419 Encounter for gynecological examination (general) (routine) without abnormal findings: Secondary | ICD-10-CM | POA: Diagnosis not present

## 2023-08-07 NOTE — Progress Notes (Signed)
Pt is in office for yearly check after surgery, pt had hyst in 2023.  Pt is up to date on Mammo- 01/2023  Pt has had some occ right lower pelvic pain, denies any urinary symptoms.

## 2023-08-07 NOTE — Progress Notes (Signed)
Alyssa Khan is a 52 y.o. G81P2002 female here for a routine annual gynecologic exam.  Current complaints: Occ lowe abd pain.   Denies abnormal vaginal bleeding, discharge, pelvic pain, problems with intercourse or other gynecologic concerns.    Gynecologic History Patient's last menstrual period was 01/25/2022 (exact date). Contraception: status post hysterectomy Last Pap: 2022. Results were: normal Last mammogram: 3/24. Results were: normal  Obstetric History OB History  Gravida Para Term Preterm AB Living  2 2 2     2   SAB IAB Ectopic Multiple Live Births          2    # Outcome Date GA Lbr Len/2nd Weight Sex Type Anes PTL Lv  2 Term 02/05/09    M CS-Unspec   LIV  1 Term 10/22/02    M CS-Unspec   LIV    Past Medical History:  Diagnosis Date   Anemia    Hyperlipidemia    Hypertension    Left breast lump    benign fibroadenoma   PONV (postoperative nausea and vomiting)     Past Surgical History:  Procedure Laterality Date   APPENDECTOMY     BREAST BIOPSY Left 11/09/2018   fibroadenoma   BREAST LUMPECTOMY Left 03/30/2010   benign   CESAREAN SECTION     removal of left breast lump  03/30/2010   per Dr. Claud Kelp   VAGINAL HYSTERECTOMY Bilateral 03/22/2022   Procedure: TOTAL VAGINAL HYSTERECTOMY WITH MORSELLATION, BILATERAL SALPINGECTOMY;  Surgeon: Hermina Staggers, MD;  Location: [facility] OR;  Service: Gynecology;  Laterality: Bilateral;    Current Outpatient Medications on File Prior to Visit  Medication Sig Dispense Refill   amLODipine (NORVASC) 5 MG tablet Take 1 tablet (5 mg total) by mouth daily. 90 tablet 3   atorvastatin (LIPITOR) 40 MG tablet Take 1 tablet (40 mg total) by mouth daily. 90 tablet 3   cyclobenzaprine (FLEXERIL) 10 MG tablet Take 1 tablet (10 mg total) by mouth 3 (three) times daily as needed for muscle spasms 90 tablet 5   ibuprofen (ADVIL) 800 MG tablet Take 1 tablet (800 mg total) by mouth every 8 (eight) hours as needed. 30 tablet 0   No  current facility-administered medications on file prior to visit.    Allergies  Allergen Reactions   Ibuprofen     Bleeding    Lisinopril Cough    Social History   Socioeconomic History   Marital status: Married    Spouse name: Not on file   Number of children: Not on file   Years of education: Not on file   Highest education level: Not on file  Occupational History   Not on file  Tobacco Use   Smoking status: Never   Smokeless tobacco: Never  Vaping Use   Vaping status: Never Used  Substance and Sexual Activity   Alcohol use: No   Drug use: No   Sexual activity: Yes  Other Topics Concern   Not on file  Social History Narrative   Not on file   Social Determinants of Health   Financial Resource Strain: Not on file  Food Insecurity: Not on file  Transportation Needs: Not on file  Physical Activity: Not on file  Stress: Not on file  Social Connections: Unknown (04/12/2022)   Received from [facility], [facility]   Social Network    Social Network: Not on file  Intimate Partner Violence: Unknown (03/04/2022)   Received from [facility], [facility]   HITS  Physically Hurt: Not on file    Insult or Talk Down To: Not on file    Threaten Physical Harm: Not on file    Scream or Curse: Not on file    Family History  Problem Relation Age of Onset   Breast cancer Sister 22   Cancer Other        breast     The following portions of the patient's history were reviewed and updated as appropriate: allergies, current medications, past family history, past medical history, past social history, past surgical history and problem list.  Review of Systems Pertinent items noted in HPI and remainder of comprehensive ROS otherwise negative.   Objective:  BP 131/88   Pulse 97   Ht 5\' 5"  (1.651 m)   Wt 146 lb (66.2 kg)   LMP 01/25/2022 (Exact Date)   BMI 24.30 kg/m  Chaperone present CONSTITUTIONAL: Well-developed, well-nourished female in no acute distress.   HENT:  Normocephalic, atraumatic, External right and left ear normal. Oropharynx is clear and moist EYES: Conjunctivae and EOM are normal. Pupils are equal, round, and reactive to light. No scleral icterus.  NECK: Normal range of motion, supple, no masses.  Normal thyroid.  SKIN: Skin is warm and dry. No rash noted. Not diaphoretic. No erythema. No pallor. NEUROLGIC: Alert and oriented to person, place, and time. Normal reflexes, muscle tone coordination. No cranial nerve deficit noted. PSYCHIATRIC: Normal mood and affect. Normal behavior. Normal judgment and thought content. CARDIOVASCULAR: Normal heart rate noted, regular rhythm RESPIRATORY: Clear to auscultation bilaterally. Effort and breath sounds normal, no problems with respiration noted. BREASTS: Symmetric in size. No masses, skin changes, nipple drainage, or lymphadenopathy. ABDOMEN: Soft, normal bowel sounds, no distention noted.  No tenderness, rebound or guarding.  PELVIC: Normal appearing external genitalia; normal appearing vaginal mucosa and cervix.  No abnormal discharge noted. Vaginal smear obtained. No adnexal masses or tenderness. Uterus and cervix surgerical absent MUSCULOSKELETAL: Normal range of motion. No tenderness.  No cyanosis, clubbing, or edema.  2+ distal pulses.   Assessment:  Annual gynecologic examination with pap smear   Plan:  Will follow up results of pap smear and manage accordingly.  Routine preventative health maintenance measures emphasized. Please refer to After Visit Summary for other counseling recommendations.    Hermina Staggers, MD, FACOG Attending Obstetrician & Gynecologist Center for [facility], [facility]

## 2023-08-09 LAB — CYTOLOGY - PAP
Comment: NEGATIVE
Diagnosis: NEGATIVE
High risk HPV: NEGATIVE

## 2023-08-30 ENCOUNTER — Ambulatory Visit (INDEPENDENT_AMBULATORY_CARE_PROVIDER_SITE_OTHER): Payer: Commercial Managed Care - PPO

## 2023-08-30 DIAGNOSIS — Z23 Encounter for immunization: Secondary | ICD-10-CM

## 2023-11-14 ENCOUNTER — Other Ambulatory Visit: Payer: Self-pay

## 2023-11-28 ENCOUNTER — Other Ambulatory Visit (HOSPITAL_COMMUNITY): Payer: Self-pay

## 2024-01-10 ENCOUNTER — Other Ambulatory Visit: Payer: Self-pay | Admitting: Family Medicine

## 2024-01-10 DIAGNOSIS — Z1231 Encounter for screening mammogram for malignant neoplasm of breast: Secondary | ICD-10-CM

## 2024-01-17 ENCOUNTER — Encounter: Payer: Commercial Managed Care - PPO | Admitting: Family Medicine

## 2024-01-23 ENCOUNTER — Other Ambulatory Visit (HOSPITAL_COMMUNITY): Payer: Self-pay

## 2024-01-23 ENCOUNTER — Ambulatory Visit (INDEPENDENT_AMBULATORY_CARE_PROVIDER_SITE_OTHER): Payer: Commercial Managed Care - PPO | Admitting: Family Medicine

## 2024-01-23 ENCOUNTER — Encounter: Payer: Self-pay | Admitting: Family Medicine

## 2024-01-23 VITALS — BP 120/76 | HR 87 | Temp 98.6°F | Ht 65.0 in | Wt 142.0 lb

## 2024-01-23 DIAGNOSIS — Z0000 Encounter for general adult medical examination without abnormal findings: Secondary | ICD-10-CM

## 2024-01-23 DIAGNOSIS — Z23 Encounter for immunization: Secondary | ICD-10-CM

## 2024-01-23 LAB — CBC WITH DIFFERENTIAL/PLATELET
Basophils Absolute: 0 10*3/uL (ref 0.0–0.1)
Basophils Relative: 0.5 % (ref 0.0–3.0)
Eosinophils Absolute: 0 10*3/uL (ref 0.0–0.7)
Eosinophils Relative: 0.8 % (ref 0.0–5.0)
HCT: 45.3 % (ref 36.0–46.0)
Hemoglobin: 14.7 g/dL (ref 12.0–15.0)
Lymphocytes Relative: 49.9 % — ABNORMAL HIGH (ref 12.0–46.0)
Lymphs Abs: 2 10*3/uL (ref 0.7–4.0)
MCHC: 32.4 g/dL (ref 30.0–36.0)
MCV: 82.9 fl (ref 78.0–100.0)
Monocytes Absolute: 0.3 10*3/uL (ref 0.1–1.0)
Monocytes Relative: 6.3 % (ref 3.0–12.0)
Neutro Abs: 1.7 10*3/uL (ref 1.4–7.7)
Neutrophils Relative %: 42.5 % — ABNORMAL LOW (ref 43.0–77.0)
Platelets: 268 10*3/uL (ref 150.0–400.0)
RBC: 5.46 Mil/uL — ABNORMAL HIGH (ref 3.87–5.11)
RDW: 13.6 % (ref 11.5–15.5)
WBC: 4 10*3/uL (ref 4.0–10.5)

## 2024-01-23 LAB — HEPATIC FUNCTION PANEL
ALT: 19 U/L (ref 0–35)
AST: 15 U/L (ref 0–37)
Albumin: 4.8 g/dL (ref 3.5–5.2)
Alkaline Phosphatase: 47 U/L (ref 39–117)
Bilirubin, Direct: 0.1 mg/dL (ref 0.0–0.3)
Total Bilirubin: 0.7 mg/dL (ref 0.2–1.2)
Total Protein: 8 g/dL (ref 6.0–8.3)

## 2024-01-23 LAB — BASIC METABOLIC PANEL
BUN: 8 mg/dL (ref 6–23)
CO2: 28 meq/L (ref 19–32)
Calcium: 9.3 mg/dL (ref 8.4–10.5)
Chloride: 103 meq/L (ref 96–112)
Creatinine, Ser: 0.68 mg/dL (ref 0.40–1.20)
GFR: 101.6 mL/min (ref >60.00)
Glucose, Bld: 98 mg/dL (ref 70–99)
Potassium: 3.8 meq/L (ref 3.5–5.1)
Sodium: 140 meq/L (ref 135–145)

## 2024-01-23 LAB — LIPID PANEL
Cholesterol: 187 mg/dL (ref 0–200)
HDL: 55.9 mg/dL (ref >39.00)
LDL Cholesterol: 116 mg/dL — ABNORMAL HIGH (ref 0–99)
NonHDL: 130.83
Total CHOL/HDL Ratio: 3
Triglycerides: 76 mg/dL (ref 0.0–149.0)
VLDL: 15.2 mg/dL (ref 0.0–40.0)

## 2024-01-23 LAB — TSH: TSH: 2.5 u[IU]/mL (ref 0.35–5.50)

## 2024-01-23 LAB — HEMOGLOBIN A1C: Hgb A1c MFr Bld: 5.8 % (ref 4.6–6.5)

## 2024-01-23 MED ORDER — AMLODIPINE BESYLATE 5 MG PO TABS
5.0000 mg | ORAL_TABLET | Freq: Every day | ORAL | 3 refills | Status: AC
Start: 2024-01-23 — End: ?
  Filled 2024-01-23 – 2024-01-31 (×2): qty 90, 90d supply, fill #0
  Filled 2024-06-18: qty 90, 90d supply, fill #1
  Filled 2024-10-15: qty 90, 90d supply, fill #2

## 2024-01-23 MED ORDER — ATORVASTATIN CALCIUM 40 MG PO TABS
40.0000 mg | ORAL_TABLET | Freq: Every day | ORAL | 3 refills | Status: AC
  Filled 2024-01-23 – 2024-01-31 (×2): qty 90, 90d supply, fill #0
  Filled 2024-06-18: qty 90, 90d supply, fill #1
  Filled 2024-10-15: qty 90, 90d supply, fill #2

## 2024-01-23 MED ORDER — CYCLOBENZAPRINE HCL 10 MG PO TABS
10.0000 mg | ORAL_TABLET | Freq: Three times a day (TID) | ORAL | 5 refills | Status: AC | PRN
  Filled 2024-01-23: qty 90, 30d supply, fill #0
  Filled 2024-06-18: qty 90, 30d supply, fill #1
  Filled 2024-10-15: qty 90, 30d supply, fill #2

## 2024-01-23 NOTE — Progress Notes (Signed)
 Subjective:    Patient ID: Alyssa Khan, female    DOB: [birth year], 53 y.o.   MRN: [id number]  HPI Here for a well exam. She has been feeling well except she mentions some intermittent numbness and tingling in the right hand that started a out 2 months ago. There is no swelling or pain.    Review of Systems  Constitutional: Negative.   HENT: Negative.    Eyes: Negative.   Respiratory: Negative.    Cardiovascular: Negative.   Gastrointestinal: Negative.   Genitourinary:  Negative for decreased urine volume, difficulty urinating, dyspareunia, dysuria, enuresis, flank pain, frequency, hematuria, pelvic pain and urgency.  Musculoskeletal: Negative.   Skin: Negative.   Neurological:  Positive for numbness. Negative for headaches.  Psychiatric/Behavioral: Negative.         Objective:   Physical Exam Constitutional:      General: She is not in acute distress.    Appearance: Normal appearance. She is well-developed.  HENT:     Head: Normocephalic and atraumatic.     Right Ear: External ear normal.     Left Ear: External ear normal.     Nose: Nose normal.     Mouth/Throat:     Pharynx: No oropharyngeal exudate.  Eyes:     General: No scleral icterus.    Conjunctiva/sclera: Conjunctivae normal.     Pupils: Pupils are equal, round, and reactive to light.  Neck:     Thyroid: No thyromegaly.     Vascular: No JVD.  Cardiovascular:     Rate and Rhythm: Normal rate and regular rhythm.     Pulses: Normal pulses.     Heart sounds: Normal heart sounds. No murmur heard.    No friction rub. No gallop.  Pulmonary:     Effort: Pulmonary effort is normal. No respiratory distress.     Breath sounds: Normal breath sounds. No wheezing or rales.  Chest:     Chest wall: No tenderness.  Abdominal:     General: Bowel sounds are normal. There is no distension.     Palpations: Abdomen is soft. There is no mass.     Tenderness: There is no abdominal tenderness. There is no guarding or  rebound.  Musculoskeletal:        General: No tenderness. Normal range of motion.     Cervical back: Normal range of motion and neck supple.  Lymphadenopathy:     Cervical: No cervical adenopathy.  Skin:    General: Skin is warm and dry.     Findings: No erythema or rash.  Neurological:     General: No focal deficit present.     Mental Status: She is alert and oriented to person, place, and time.     Cranial Nerves: No cranial nerve deficit.     Motor: No abnormal muscle tone.     Coordination: Coordination normal.     Deep Tendon Reflexes: Reflexes are normal and symmetric. Reflexes normal.  Psychiatric:        Mood and Affect: Mood normal.        Behavior: Behavior normal.        Thought Content: Thought content normal.        Judgment: Judgment normal.           Assessment & Plan:  Well exam. We discussed diet and exercise. Get fasting labs. Her right hand numbness may be the start of a carpal tunnel syndrome. I suggested she wear a wrsit brace in bed every  night. Set up her first colonoscopy.  Gershon Crane, MD

## 2024-01-23 NOTE — Addendum Note (Signed)
 Addended by: Carola Rhine on: 01/23/2024 09:34 AM   Modules accepted: Orders

## 2024-01-24 ENCOUNTER — Encounter: Payer: Self-pay | Admitting: Internal Medicine

## 2024-01-31 ENCOUNTER — Other Ambulatory Visit (HOSPITAL_COMMUNITY): Payer: Self-pay

## 2024-01-31 ENCOUNTER — Ambulatory Visit
Admission: RE | Admit: 2024-01-31 | Discharge: 2024-01-31 | Disposition: A | Discharge status: Home / Self Care | Payer: Commercial Managed Care - PPO | Source: Ambulatory Visit | Attending: Family Medicine | Admitting: Family Medicine

## 2024-01-31 DIAGNOSIS — Z1231 Encounter for screening mammogram for malignant neoplasm of breast: Secondary | ICD-10-CM

## 2024-02-01 ENCOUNTER — Other Ambulatory Visit (HOSPITAL_COMMUNITY): Payer: Self-pay

## 2024-02-05 ENCOUNTER — Other Ambulatory Visit: Payer: Self-pay | Admitting: Family Medicine

## 2024-02-05 DIAGNOSIS — R928 Other abnormal and inconclusive findings on diagnostic imaging of breast: Secondary | ICD-10-CM

## 2024-02-06 ENCOUNTER — Telehealth: Payer: Self-pay

## 2024-02-06 NOTE — Telephone Encounter (Signed)
 Copied from CRM [id number]. Topic: Appointments - Appointment Scheduling >> Feb 06, 2024 10:18 AM [place] J wrote: Patient would like to reschedule mammogram appointment earlier but needed to consult Dr. Clent Ridges. Please call patient back for rescheduling.

## 2024-02-07 NOTE — Telephone Encounter (Signed)
 Left pt a message advised to call the office back with any questions

## 2024-02-14 NOTE — Telephone Encounter (Signed)
 Pt has appointment with [facility] on 02/20/24

## 2024-02-20 ENCOUNTER — Ambulatory Visit
Admission: RE | Admit: 2024-02-20 | Discharge: 2024-02-20 | Disposition: A | Discharge status: Home / Self Care | Source: Ambulatory Visit | Attending: Family Medicine | Admitting: Family Medicine

## 2024-02-20 ENCOUNTER — Ambulatory Visit

## 2024-02-20 ENCOUNTER — Ambulatory Visit: Admission: RE | Admit: 2024-02-20 | Source: Ambulatory Visit

## 2024-02-20 DIAGNOSIS — R928 Other abnormal and inconclusive findings on diagnostic imaging of breast: Secondary | ICD-10-CM

## 2024-02-21 ENCOUNTER — Ambulatory Visit (AMBULATORY_SURGERY_CENTER): Payer: Commercial Managed Care - PPO

## 2024-02-21 ENCOUNTER — Other Ambulatory Visit (HOSPITAL_COMMUNITY): Payer: Self-pay

## 2024-02-21 VITALS — Ht 65.0 in | Wt 142.0 lb

## 2024-02-21 DIAGNOSIS — Z1211 Encounter for screening for malignant neoplasm of colon: Secondary | ICD-10-CM

## 2024-02-21 MED ORDER — NA SULFATE-K SULFATE-MG SULF 17.5-3.13-1.6 GM/177ML PO SOLN
1.0000 | Freq: Once | ORAL | 0 refills | Status: AC
Start: 2024-02-21 — End: 2024-02-27
  Filled 2024-02-21: qty 354, 1d supply, fill #0

## 2024-02-21 NOTE — Progress Notes (Signed)

## 2024-02-26 ENCOUNTER — Other Ambulatory Visit (HOSPITAL_COMMUNITY): Payer: Self-pay

## 2024-02-26 ENCOUNTER — Encounter: Payer: Self-pay | Admitting: Internal Medicine

## 2024-02-29 ENCOUNTER — Ambulatory Visit (AMBULATORY_SURGERY_CENTER): Payer: Commercial Managed Care - PPO | Admitting: Internal Medicine

## 2024-02-29 ENCOUNTER — Encounter: Payer: Self-pay | Admitting: Internal Medicine

## 2024-02-29 VITALS — BP 125/79 | HR 70 | Temp 98.2°F | Resp 15 | Ht 65.0 in | Wt 142.0 lb

## 2024-02-29 DIAGNOSIS — Z1211 Encounter for screening for malignant neoplasm of colon: Secondary | ICD-10-CM

## 2024-02-29 DIAGNOSIS — K5730 Diverticulosis of large intestine without perforation or abscess without bleeding: Secondary | ICD-10-CM | POA: Diagnosis not present

## 2024-02-29 DIAGNOSIS — I10 Essential (primary) hypertension: Secondary | ICD-10-CM | POA: Diagnosis not present

## 2024-02-29 DIAGNOSIS — D125 Benign neoplasm of sigmoid colon: Secondary | ICD-10-CM | POA: Diagnosis not present

## 2024-02-29 DIAGNOSIS — K648 Other hemorrhoids: Secondary | ICD-10-CM | POA: Diagnosis not present

## 2024-02-29 DIAGNOSIS — E785 Hyperlipidemia, unspecified: Secondary | ICD-10-CM | POA: Diagnosis not present

## 2024-02-29 MED ORDER — SODIUM CHLORIDE 0.9 % IV SOLN: 500.0000 mL | INTRAVENOUS | Status: DC

## 2024-02-29 NOTE — Op Note (Signed)
 [facility] Patient Name: Alyssa Khan Procedure Date: 02/29/2024 8:44 AM MRN: [id number] Endoscopist: Madelyn Brunner [place] , , [id number] Age: 53 Referring MD:  Date of Birth: Apr 16, 1971 Gender: Female Account #: [id number] Procedure:                Colonoscopy Indications:              Screening for colorectal malignant neoplasm, This                            is the patient's first colonoscopy Medicines:                Monitored Anesthesia Care Procedure:                Pre-Anesthesia Assessment:                           - Prior to the procedure, a History and Physical                            was performed, and patient medications and                            allergies were reviewed. The patient's tolerance of                            previous anesthesia was also reviewed. The risks                            and benefits of the procedure and the sedation                            options and risks were discussed with the patient.                            All questions were answered, and informed consent                            was obtained. Prior Anticoagulants: The patient has                            taken no anticoagulant or antiplatelet agents. ASA                            Grade Assessment: II - A patient with mild systemic                            disease. After reviewing the risks and benefits,                            the patient was deemed in satisfactory condition to                            undergo the procedure.  After obtaining informed consent, the colonoscope                            was passed under direct vision. Throughout the                            procedure, the patient's blood pressure, pulse, and                            oxygen saturations were monitored continuously. The                            Olympus CF-HQ190L ([id number]) Colonoscope was                            introduced through  the anus and advanced to the the                            terminal ileum. The colonoscopy was performed                            without difficulty. The patient tolerated the                            procedure well. The quality of the bowel                            preparation was excellent. The terminal ileum,                            ileocecal valve, appendiceal orifice, and rectum                            were photographed. Scope In: 8:56:10 AM Scope Out: 9:12:27 AM Scope Withdrawal Time: 0 hours 12 minutes 52 seconds  Total Procedure Duration: 0 hours 16 minutes 17 seconds  Findings:                 The terminal ileum appeared normal.                           A single diverticulum was found in the transverse                            colon.                           A 3 mm polyp was found in the sigmoid colon. The                            polyp was sessile. The polyp was removed with a                            cold snare. Resection and retrieval were complete.  Non-bleeding internal hemorrhoids were found during                            retroflexion. Complications:            No immediate complications. Estimated Blood Loss:     Estimated blood loss was minimal. Impression:               - The examined portion of the ileum was normal.                           - Diverticulosis in the transverse colon.                           - One 3 mm polyp in the sigmoid colon, removed with                            a cold snare. Resected and retrieved.                           - Non-bleeding internal hemorrhoids. Recommendation:           - Discharge patient to home (with escort).                           - Await pathology results.                           - The findings and recommendations were discussed                            with the patient. Dr Particia Lather "Alan Ripper" Leonides Schanz,  02/29/2024 9:16:26 AM

## 2024-02-29 NOTE — Patient Instructions (Signed)
 Resume previous diet and medications. Awaiting pathology results. Repeat Colonoscopy date to be determined based on pathology results.  Handout provided on Colon polyps   YOU HAD AN ENDOSCOPIC PROCEDURE TODAY AT THE [facility]:   Refer to the procedure report that was given to you for any specific questions about what was found during the examination.  If the procedure report does not answer your questions, please call your gastroenterologist to clarify.  If you requested that your care partner not be given the details of your procedure findings, then the procedure report has been included in a sealed envelope for you to review at your convenience later.  YOU SHOULD EXPECT: Some feelings of bloating in the abdomen. Passage of more gas than usual.  Walking can help get rid of the air that was put into your GI tract during the procedure and reduce the bloating. If you had a lower endoscopy (such as a colonoscopy or flexible sigmoidoscopy) you may notice spotting of blood in your stool or on the toilet paper. If you underwent a bowel prep for your procedure, you may not have a normal bowel movement for a few days.  Please Note:  You might notice some irritation and congestion in your nose or some drainage.  This is from the oxygen used during your procedure.  There is no need for concern and it should clear up in a day or so.  SYMPTOMS TO REPORT IMMEDIATELY:  Following lower endoscopy (colonoscopy or flexible sigmoidoscopy):  Excessive amounts of blood in the stool  Significant tenderness or worsening of abdominal pains  Swelling of the abdomen that is new, acute  Fever of 100F or higher  For urgent or emergent issues, a gastroenterologist can be reached at any hour by calling [phone]. Do not use MyChart messaging for urgent concerns.    DIET:  We do recommend a small meal at first, but then you may proceed to your regular diet.  Drink plenty of fluids but you should avoid  alcoholic beverages for 24 hours.  ACTIVITY:  You should plan to take it easy for the rest of today and you should NOT DRIVE or use heavy machinery until tomorrow (because of the sedation medicines used during the test).    FOLLOW UP: Our staff will call the number listed on your records the next business day following your procedure.  We will call around 7:15- 8:00 am to check on you and address any questions or concerns that you may have regarding the information given to you following your procedure. If we do not reach you, we will leave a message.     If any biopsies were taken you will be contacted by phone or by letter within the next 1-3 weeks.  Please call us at [phone] if you have not heard about the biopsies in 3 weeks.    SIGNATURES/CONFIDENTIALITY: You and/or your care partner have signed paperwork which will be entered into your electronic medical record.  These signatures attest to the fact that that the information above on your After Visit Summary has been reviewed and is understood.  Full responsibility of the confidentiality of this discharge information lies with you and/or your care-partner.

## 2024-02-29 NOTE — Progress Notes (Signed)
 Called to room to assist during endoscopic procedure.  Patient ID and intended procedure confirmed with present staff. Received instructions for my participation in the procedure from the performing physician.

## 2024-02-29 NOTE — Progress Notes (Signed)
 Sedate, gd SR, tolerated procedure well, VSS, report to RN

## 2024-02-29 NOTE — Progress Notes (Signed)
 Pt's states no medical or surgical changes since previsit or office visit.

## 2024-02-29 NOTE — Progress Notes (Signed)
 GASTROENTEROLOGY PROCEDURE H&P NOTE   Primary Care Physician: Nelwyn Salisbury, MD    Reason for Procedure:   Colon cancer screening  Plan:    Colonoscopy  Patient is appropriate for endoscopic procedure(s) in the ambulatory ([facility]) setting.  The nature of the procedure, as well as the risks, benefits, and alternatives were carefully and thoroughly reviewed with the patient. Ample time for discussion and questions allowed. The patient understood, was satisfied, and agreed to proceed.     HPI: Alyssa Khan is a 53 y.o. female who presents for colonoscopy for colon cancer screening. Denies blood in stools, changes in bowel habits, or unintentional weight loss. Denies family history of colon cancer.  Past Medical History:  Diagnosis Date   Anemia    Hyperlipidemia    Hypertension    Left breast lump    benign fibroadenoma   PONV (postoperative nausea and vomiting)     Past Surgical History:  Procedure Laterality Date   APPENDECTOMY     BREAST BIOPSY Left 11/09/2018   fibroadenoma   BREAST LUMPECTOMY Left 03/30/2010   benign   CESAREAN SECTION     removal of left breast lump  03/30/2010   per Dr. Claud Kelp   VAGINAL HYSTERECTOMY Bilateral 03/22/2022   Procedure: TOTAL VAGINAL HYSTERECTOMY WITH MORSELLATION, BILATERAL SALPINGECTOMY;  Surgeon: Hermina Staggers, MD;  Location: [facility] OR;  Service: Gynecology;  Laterality: Bilateral;    Prior to Admission medications   Medication Sig Start Date End Date Taking? Authorizing Provider  amLODipine (NORVASC) 5 MG tablet Take 1 tablet (5 mg total) by mouth daily. 01/23/24  Yes Nelwyn Salisbury, MD  atorvastatin (LIPITOR) 40 MG tablet Take 1 tablet (40 mg total) by mouth daily. 01/23/24  Yes Nelwyn Salisbury, MD  cyclobenzaprine (FLEXERIL) 10 MG tablet Take 1 tablet (10 mg total) by mouth 3 (three) times daily as needed for muscle spasms 01/23/24  Yes Nelwyn Salisbury, MD  ibuprofen (ADVIL) 800 MG tablet Take 1 tablet (800 mg total) by  mouth every 8 (eight) hours as needed. 03/23/22   Hermina Staggers, MD    Current Outpatient Medications  Medication Sig Dispense Refill   amLODipine (NORVASC) 5 MG tablet Take 1 tablet (5 mg total) by mouth daily. 90 tablet 3   atorvastatin (LIPITOR) 40 MG tablet Take 1 tablet (40 mg total) by mouth daily. 90 tablet 3   cyclobenzaprine (FLEXERIL) 10 MG tablet Take 1 tablet (10 mg total) by mouth 3 (three) times daily as needed for muscle spasms 90 tablet 5   ibuprofen (ADVIL) 800 MG tablet Take 1 tablet (800 mg total) by mouth every 8 (eight) hours as needed. 30 tablet 0   Current Facility-Administered Medications  Medication Dose Route Frequency Provider Last Rate Last Admin   0.9 %  sodium chloride infusion  500 mL Intravenous Continuous Imogene Burn, MD        Allergies as of 02/29/2024 - Review Complete 02/29/2024  Allergen Reaction Noted   Lisinopril Cough 12/22/2014    Family History  Problem Relation Age of Onset   Breast cancer Sister 69   Cancer Other        breast    Colon cancer Neg Hx    Rectal cancer Neg Hx    Stomach cancer Neg Hx    Esophageal cancer Neg Hx     Social History   Socioeconomic History   Marital status: Married    Spouse name: Not on file  Number of children: Not on file   Years of education: Not on file   Highest education level: Not on file  Occupational History   Not on file  Tobacco Use   Smoking status: Never   Smokeless tobacco: Never  Vaping Use   Vaping status: Never Used  Substance and Sexual Activity   Alcohol use: Yes    Comment: wine occasionally   Drug use: No   Sexual activity: Yes  Other Topics Concern   Not on file  Social History Narrative   Not on file   Social Drivers of Health   Financial Resource Strain: Not on file  Food Insecurity: Not on file  Transportation Needs: Not on file  Physical Activity: Not on file  Stress: Not on file  Social Connections: Unknown (04/12/2022)   Received from [facility]   Social Network    Social Network: Not on file  Intimate Partner Violence: Unknown (03/04/2022)   Received from [facility]   HITS    Physically Hurt: Not on file    Insult or Talk Down To: Not on file    Threaten Physical Harm: Not on file    Scream or Curse: Not on file    Physical Exam: Vital signs in last 24 hours: BP (!) 146/87   Pulse 82   Temp 98.2 F (36.8 C)   Ht 5\' 5"  (1.651 m)   Wt 142 lb (64.4 kg)   LMP 01/25/2022 (Exact Date)   SpO2 98%   BMI 23.63 kg/m  GEN: NAD EYE: Sclerae anicteric ENT: MMM CV: Non-tachycardic Pulm: No increased work of breathing GI: Soft, NT/ND NEURO:  Alert & Oriented   Eulah Pont, MD  Gastroenterology  02/29/2024 8:50 AM

## 2024-03-01 ENCOUNTER — Telehealth: Payer: Self-pay

## 2024-03-01 NOTE — Telephone Encounter (Signed)
 Left message n answering machine.

## 2024-03-04 ENCOUNTER — Encounter: Payer: Self-pay | Admitting: Internal Medicine

## 2024-03-04 LAB — SURGICAL PATHOLOGY

## 2024-07-22 DIAGNOSIS — H524 Presbyopia: Secondary | ICD-10-CM | POA: Diagnosis not present

## 2024-10-15 ENCOUNTER — Ambulatory Visit (INDEPENDENT_AMBULATORY_CARE_PROVIDER_SITE_OTHER)

## 2024-10-15 DIAGNOSIS — Z23 Encounter for immunization: Secondary | ICD-10-CM

## 2024-10-29 ENCOUNTER — Other Ambulatory Visit (HOSPITAL_COMMUNITY): Payer: Self-pay

## 2024-10-29 MED ORDER — ATOVAQUONE-PROGUANIL HCL 250-100 MG PO TABS
1.0000 | ORAL_TABLET | Freq: Every day | ORAL | 0 refills | Status: AC
  Filled 2024-10-29: qty 84, 84d supply, fill #0

## 2024-10-29 MED ORDER — AZITHROMYCIN 500 MG PO TABS
500.0000 mg | ORAL_TABLET | Freq: Every day | ORAL | 0 refills | Status: AC
  Filled 2024-10-29: qty 4, 3d supply, fill #0

## 2024-10-30 ENCOUNTER — Other Ambulatory Visit (HOSPITAL_COMMUNITY): Payer: Self-pay
